# Patient Record
Sex: Male | Born: 1965 | Race: Black or African American | Hispanic: No | Marital: Married
Health system: Midwestern US, Community
[De-identification: ages and names within clinical notes are randomized; demographics above are authoritative.]

## PROBLEM LIST (undated history)

## (undated) DIAGNOSIS — E785 Hyperlipidemia, unspecified: Secondary | ICD-10-CM

## (undated) DIAGNOSIS — R0789 Other chest pain: Secondary | ICD-10-CM

## (undated) DIAGNOSIS — D497 Neoplasm of unspecified behavior of endocrine glands and other parts of nervous system: Secondary | ICD-10-CM

## (undated) DIAGNOSIS — Z1211 Encounter for screening for malignant neoplasm of colon: Secondary | ICD-10-CM

## (undated) DIAGNOSIS — F410 Panic disorder [episodic paroxysmal anxiety] without agoraphobia: Secondary | ICD-10-CM

## (undated) DIAGNOSIS — M25532 Pain in left wrist: Secondary | ICD-10-CM

## (undated) HISTORY — DX: Panic disorder (episodic paroxysmal anxiety): F41.0

## (undated) HISTORY — DX: Other chest pain: R07.89

## (undated) HISTORY — DX: Encounter for screening for malignant neoplasm of colon: Z12.11

## (undated) HISTORY — DX: Hyperlipidemia, unspecified: E78.5

---

## 2005-05-12 HISTORY — PX: CARDIOVASCULAR STRESS TEST: SHX262

## 2006-03-31 ENCOUNTER — Observation Stay (HOSPITAL_COMMUNITY): Admission: EM | Admit: 2006-03-31 | Discharge: 2006-04-01 | Payer: Self-pay | Admitting: Emergency Medicine

## 2006-07-06 ENCOUNTER — Ambulatory Visit (HOSPITAL_COMMUNITY): Admission: RE | Admit: 2006-07-06 | Discharge: 2006-07-06 | Payer: Self-pay | Admitting: Family Medicine

## 2009-05-12 DIAGNOSIS — D497 Neoplasm of unspecified behavior of endocrine glands and other parts of nervous system: Secondary | ICD-10-CM

## 2009-05-12 HISTORY — DX: Neoplasm of unspecified behavior of endocrine glands and other parts of nervous system: D49.7

## 2009-05-18 ENCOUNTER — Ambulatory Visit (HOSPITAL_COMMUNITY): Admission: RE | Admit: 2009-05-18 | Discharge: 2009-05-18 | Payer: Self-pay | Admitting: Family Medicine

## 2010-09-27 NOTE — H&P (Signed)
NAME:  Billy Alvarez, Billy Alvarez             ACCOUNT NO.:  [id number]   MEDICAL RECORD NO.:  [id number]          PATIENT TYPE:  EMS   LOCATION:  MAJO                         FACILITY:  [facility]   PHYSICIAN:  Kela Millin, M.D.DATE OF BIRTH:  December 16, 1965   DATE OF ADMISSION:  03/31/2006  DATE OF DISCHARGE:                              HISTORY & PHYSICAL   PRIMARY CARE PHYSICIAN:  Unassigned.   CHIEF COMPLAINT:  Chest pain.   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  He describes the pain as midsternal in location, that  it feels like a pressure like something sitting on my chest, 5/10 in  intensity at its worse, lasting a couple of hours at a time.  The  patient also states that it is associated with nausea, shortness of  breath, and radiates to his neck.  Billy Alvarez also reports that he  experiences the pain mostly while he is driving, although sometimes when  he is just resting.  The patient also states that the pain has worsened  in intensity and duration over the past 2-3 weeks.  He denies cough,  fevers, hematemesis, melena, diarrhea, and no hematochezia.   He was seen in the ER and the D-dimer was normal at less than 0.22.  Chest x-ray negative for  infiltrates and his point of care markers are  negative.  He continues to have some chest discomfort; and he is  admitted to the [facility] service for further evaluation and  management.   PAST MEDICAL HISTORY:  None.   MEDICATIONS:  None.   ALLERGIES:  None.   SOCIAL HISTORY:  He denies tobacco.  He also denies alcohol and no  illicit drug use.   FAMILY HISTORY:  He states that his father had an MI at age 53.  He  denies any family history of diabetes, hypertension, or hyperlipidemia.   REVIEW OF SYSTEMS:  See HPI, other review  of systems negative.   PHYSICAL EXAMINATION:  GENERAL:  The patient is a middle-aged white  male.  He is alert and oriented in no apparent distress, well developed  and well nourished.  VITAL SIGNS:  His temperature is 97.5, blood pressure 126/79, initially  137/80, and his pulse is 59, respiratory rate is 16, O2 saturation is  99% on room air.  HEENT:  PERRL, EOMI.  Sclerae anicteric.  Moist mucous membranes.  No  oral exudates.  NECK:  Supple.  No lymphadenopathy, no thyromegaly.  No JVD.  LUNGS:  Clear to auscultation bilaterally.  No crackles or wheezes.  CARDIOVASCULAR:  Regular rate and rhythm.  Normal S1 and S2, no murmurs.  No gallops and no rubs.  ABDOMEN:  Soft, bowel sounds present, nontender, nondistended.  No  organomegaly and no masses palpable.  EXTREMITIES:  No cyanosis and no edema.  NEUROLOGIC:  Alert and oriented x3.  Cranial nerves II-XII grossly  intact, nonfocal exam.  LABORATORY DATA:  Chest x-ray--no acute infiltrates.  Point of care  markers are negative x2.  The D-dimer is less than 0.22.  Sodium 138,  potassium 4.6, chloride 104, BUN 18, glucose 97, and his creatinine is  1.1.  Hemoglobin is 14.3, hematocrit 42%. His pH is 7.34, pCO2 of 57 and  his INR is 1.0.  EKG--sinus bradycardia at a rate of 56, no acute  ischemic changes noted.   ASSESSMENT AND PLAN:  ?  Unstable angina--as discussed above.  The  patient is a 45 year old white male who presents with chest pain,  worsening over the past 2-3 weeks. EKG showing sinus bradycardia with a  rate of 56, no ischemic changes noted and point of care markers  negative.  He does not have a primary care physician and has not been to  any doctor in a while.  I will obtain serial cardiac enzymes and an EKG.  Will place on aspirin, nitrates, and Lovenox.  We will consult  cardiology and follow.  Will hold off beta blockers secondary to  bradycardia and follow.  Will obtain a fasting lipid profile.  Consult   cardiology for further recommendations/risk stratification.      Kela Millin, M.D.  Electronically Signed     ACV/MEDQ  D:  03/31/2006  T:  03/31/2006  Job:  [id number]

## 2010-09-27 NOTE — Consult Note (Signed)
NAME:  Billy Alvarez NO.:  [id number]   MEDICAL RECORD NO.:  [id number]          PATIENT TYPE:  INP   LOCATION:  1828                         FACILITY:  [facility]   PHYSICIAN:  Vesta Mixer, M.D. DATE OF BIRTH:  [birth year]   DATE OF CONSULTATION:  03/31/2006  DATE OF DISCHARGE:                                   CONSULTATION   Billy Alvarez is a 45 year old gentleman who was admitted to the hospital  with episodes of chest pain.  We are asked to see him for further  evaluation.   Billy Alvarez has been a relatively healthy gentleman.  He really has not ever been  to see a doctor since his pediatrician.   He presents now with 2 to 3 weeks of some chest pressure and chest  tightness.  He states that the sensation comes and goes.  It is not  associated with any specific activity.  Specifically, it is not associated  with eating, drinking change of position, taking a deep breath or exercise.  He has noticed that he is a little more short of breath when he climbs  stairs recently.  He presented to emergency room today.  We were asked to  see him for further evaluation.   He has been quite comfortable in the ER.  He states that he still has a  nagging sensation in his lower chest but has not really had any episodes of  chest pain.   CURRENT MEDICATIONS:  None.   ALLERGIES:  None.   PAST MEDICAL HISTORY:  None.   SOCIAL HISTORY:  The patient works for a Nurse, adult.  He is a  nonsmoker and does not drink alcohol.   FAMILY HISTORY:  His father had a myocardial infarction at age 53.   His review of systems was reviewed and is essentially negative.   He is a young white gentleman in no acute distress.  His vital signs are  stable.  LUNGS:  Clear to auscultation.  HEART:  Regular rate, S1-S2.  He has no murmurs.  ABDOMINAL EXAM:  Reveals good bowel sounds and is nontender.  EXTREMITIES:  There is no clubbing, cyanosis or edema.  His pulses are  intact.   He has no calf tenderness.  No palpable cords.  NEUROLOGICAL EXAM:  Is nonfocal.  His cranial nerves II-XII are intact.  His  motor and sensory function are intact.   EKG reveals sinus bradycardia.  He has no ST- or T-wave changes.   His cardiac enzymes (point care markers) are negative.   IMPRESSION/PLAN:  1. Chest pain.  The patient presents with rather atypical chest pain.  The      chest pain has been intermittent for 2 weeks.  His enzymes are      negative.  At this point, I would agree with admission.  We will check      his cardiac enzymes and ambulate him in the morning.  If he does well      and does not have any further episodes of chest pain and if his enzymes      remain  negative, then we can probably discharge him to home and get the      remaining evaluation as an outpatient.  I do not think that we need to      have him sit around the hospital over the Thanksgiving weekend if he      remains very clinically stable.  He has my card and will me for      questions.   All of his other medical problems are stable.  We will check a fasting lipid  profile in the morning.           ______________________________  Vesta Mixer, M.D.     PJN/MEDQ  D:  03/31/2006  T:  04/01/2006  Job:  [id number]

## 2011-03-06 DIAGNOSIS — D352 Benign neoplasm of pituitary gland: Secondary | ICD-10-CM | POA: Insufficient documentation

## 2014-09-21 DIAGNOSIS — E221 Hyperprolactinemia: Secondary | ICD-10-CM | POA: Insufficient documentation

## 2015-05-16 ENCOUNTER — Emergency Department (HOSPITAL_COMMUNITY)
Admission: EM | Admit: 2015-05-16 | Discharge: 2015-05-16 | Disposition: A | Discharge status: Home / Self Care | Payer: 59 | Attending: Emergency Medicine | Admitting: Emergency Medicine

## 2015-05-16 ENCOUNTER — Emergency Department (HOSPITAL_COMMUNITY): Payer: 59

## 2015-05-16 ENCOUNTER — Encounter (HOSPITAL_COMMUNITY): Payer: Self-pay

## 2015-05-16 DIAGNOSIS — Z8603 Personal history of neoplasm of uncertain behavior: Secondary | ICD-10-CM | POA: Diagnosis not present

## 2015-05-16 DIAGNOSIS — R110 Nausea: Secondary | ICD-10-CM | POA: Diagnosis not present

## 2015-05-16 DIAGNOSIS — R0789 Other chest pain: Secondary | ICD-10-CM | POA: Diagnosis not present

## 2015-05-16 DIAGNOSIS — R079 Chest pain, unspecified: Secondary | ICD-10-CM | POA: Diagnosis present

## 2015-05-16 DIAGNOSIS — R42 Dizziness and giddiness: Secondary | ICD-10-CM | POA: Insufficient documentation

## 2015-05-16 HISTORY — DX: Neoplasm of unspecified behavior of endocrine glands and other parts of nervous system: D49.7

## 2015-05-16 LAB — CBC
HEMATOCRIT: 41.5 % (ref 39.0–52.0)
Hemoglobin: 14.3 g/dL (ref 13.0–17.0)
MCH: 30.6 pg (ref 26.0–34.0)
MCHC: 34.5 g/dL (ref 30.0–36.0)
MCV: 88.9 fL (ref 78.0–100.0)
PLATELETS: 259 10*3/uL (ref 150–400)
RBC: 4.67 MIL/uL (ref 4.22–5.81)
RDW: 13.3 % (ref 11.5–15.5)
WBC: 7.2 10*3/uL (ref 4.0–10.5)

## 2015-05-16 LAB — BASIC METABOLIC PANEL
Anion gap: 9 (ref 5–15)
BUN: 20 mg/dL (ref 6–20)
CHLORIDE: 103 mmol/L (ref 101–111)
CO2: 28 mmol/L (ref 22–32)
CREATININE: 1.08 mg/dL (ref 0.61–1.24)
Calcium: 9.6 mg/dL (ref 8.9–10.3)
GFR calc non Af Amer: >60 mL/min (ref >60)
Glucose, Bld: 122 mg/dL — ABNORMAL HIGH (ref 65–99)
Potassium: 3.9 mmol/L (ref 3.5–5.1)
Sodium: 140 mmol/L (ref 135–145)

## 2015-05-16 LAB — TROPONIN I
Troponin I: <0.03 ng/mL (ref <0.031)
Troponin I: <0.03 ng/mL (ref <0.031)

## 2015-05-16 MED ORDER — ASPIRIN 81 MG PO CHEW
324.0000 mg | CHEWABLE_TABLET | Freq: Once | ORAL | Status: AC
  Administered 2015-05-16: 324 mg via ORAL
  Filled 2015-05-16: qty 4

## 2015-05-16 MED ORDER — NITROGLYCERIN 0.4 MG SL SUBL: 0.4000 mg | SUBLINGUAL_TABLET | SUBLINGUAL | Status: DC | PRN

## 2015-05-16 MED ORDER — NITROGLYCERIN 2 % TD OINT
1.0000 [in_us] | TOPICAL_OINTMENT | Freq: Once | TRANSDERMAL | Status: AC
  Administered 2015-05-16: 1 [in_us] via TOPICAL
  Filled 2015-05-16: qty 1

## 2015-05-16 NOTE — Discharge Instructions (Signed)
Keep your appointment next week with your new primary care doctor. Call Dr. Kyla Balzarine office to get an appointment to have him evaluate her chest pain again. Try the nitroglycerin sublingual tablets if you get the chest pain again. Please sit down when you use the tablets so you don't get lightheaded or pass out. Return to the ED if your symptoms seem worse.

## 2015-05-16 NOTE — ED Notes (Signed)
Patient states minimal chest pain over the past week, this morning patient states he woke up with dull pressure in central chest with nausea.

## 2015-05-16 NOTE — ED Provider Notes (Signed)
CSN: [id number]     Arrival date & time 05/16/15  0324 History   First MD Initiated Contact with Patient 05/16/15 [phone]    Chief Complaint  Patient presents with  . Chest Pain     (Consider location/radiation/quality/duration/timing/severity/associated sxs/prior Treatment) HPI patient reports for the past few weeks he hasn't felt right and describes he has a pressure and tightness feeling in his chest. He states it can last for hours. He states he gets it when he feels stressed such as driving to work. He states he started going to the gym and he can exercise on the treadmill without any discomfort. He also states sometimes he suddenly feels woozy and dizzy and he get clammy. He states tonight about 2 AM he woke up and he had nausea and he felt lightheaded and felt like his heart was racing. He finally woke up his wife about a half hour later and his heart rate was 102. He states normally his heart rates around 60. He had some sweating and some mild nausea. He states he feels anxious and although he's not under any increased stress he feels stressed. He states he had similar symptoms about 7 or 8 years ago and was seen by Dr Johnsie Cancel, cardiologist and had a stress test done that was okay. He denies history of hypertension, diabetes, but states he was told his cholesterol was high however he has not taken any medication for that in years. He states his father died at age 11 and he had had a MI or congestive heart failure and bypass surgery. His paternal grandmother also had a history of an MI.   PCP Dr Ernestine Conrad, first new appt in 5 days, has not seen in several years  Past Medical History  Diagnosis Date  . 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 No pertinent past surgical history. History reviewed. No pertinent family history. Social History  Substance Use Topics  . Smoking status: Never Smoker   . Smokeless tobacco: None  . Alcohol Use: No  employed Lives with spouse  Review of Systems   All other systems reviewed and are negative.     Allergies  Review of patient's allergies indicates no known allergies.  Home Medications   Prior to Admission medications   Medication Sig Start Date End Date Taking? Authorizing Provider  cabergoline (DOSTINEX) 0.5 MG tablet Take 0.5 mg by mouth 2 (two) times a week.   Yes Historical Provider, MD  nitroGLYCERIN (NITROSTAT) 0.4 MG SL tablet Place 1 tablet (0.4 mg total) under the tongue every 5 (five) minutes as needed for chest pain. 05/16/15   Rolland Porter, MD   BP 113/72 mmHg  Pulse 59  Temp(Src) 98.1 F (36.7 C) (Oral)  Resp 12  Ht 6\' 2"  (1.88 m)  Wt 210 lb (95.255 kg)  BMI 26.95 kg/m2  SpO2 98%  Vital signs normal   Physical Exam  Constitutional: He is oriented to person, place, and time. He appears well-developed and well-nourished.  Non-toxic appearance. He does not appear ill. No distress.  HENT:  Head: Normocephalic and atraumatic.  Right Ear: External ear normal.  Left Ear: External ear normal.  Nose: Nose normal. No mucosal edema or rhinorrhea.  Mouth/Throat: Oropharynx is clear and moist and mucous membranes are normal. No dental abscesses or uvula swelling.  Eyes: Conjunctivae and EOM are normal. Pupils are equal, round, and reactive to light.  Neck: Normal range of motion and full passive range of motion without pain. Neck supple.  Cardiovascular: Normal rate, regular rhythm and normal heart sounds.  Exam reveals no gallop and no friction rub.   No murmur heard. Pulmonary/Chest: Effort normal and breath sounds normal. No respiratory distress. He has no wheezes. He has no rhonchi. He has no rales. He exhibits no tenderness and no crepitus.  Abdominal: Soft. Normal appearance and bowel sounds are normal. He exhibits no distension. There is no tenderness. There is no rebound and no guarding.  Musculoskeletal: Normal range of motion. He exhibits no edema or tenderness.  Moves all extremities well.   Neurological: He is  alert and oriented to person, place, and time. He has normal strength. No cranial nerve deficit.  Skin: Skin is warm, dry and intact. No rash noted. No erythema. No pallor.  Psychiatric: He has a normal mood and affect. His speech is normal and behavior is normal. His mood appears not anxious.  Nursing note and vitals reviewed.   ED Course  Procedures (including critical care time)  Medications  aspirin chewable tablet 324 mg (324 mg Oral Given 05/16/15 0407)  nitroGLYCERIN (NITROGLYN) 2 % ointment 1 inch (1 inch Topical Given 05/16/15 0407)    Patient had nitroglycerin paste placed and was given a full dose of aspirin to chew. Evaluation for possible cardiac event was started.  5 AM we discussed patient's test results. We also discussed need to get the delta troponin done around 6 AM. He states he currently is not having any chest discomfort.  Recheck at 6:55 AM patient is smiling and states he feels better. We discussed following up with his cardiologist. We will try sublingual nitroglycerin to use for his chest pain recurs. His chest pain is atypical and that he does not get it with exertion and may be more related to anxiety.   Labs Review Results for orders placed or performed during the hospital encounter of [id number]  Basic metabolic panel  Result Value Ref Range   Sodium 140 135 - 145 mmol/L   Potassium 3.9 3.5 - 5.1 mmol/L   Chloride 103 101 - 111 mmol/L   CO2 28 22 - 32 mmol/L   Glucose, Bld 122 (H) 65 - 99 mg/dL   BUN 20 6 - 20 mg/dL   Creatinine, Ser 1.08 0.61 - 1.24 mg/dL   Calcium 9.6 8.9 - 10.3 mg/dL   GFR calc non Af Amer >60 >60 mL/min   GFR calc Af Amer >60 >60 mL/min   Anion gap 9 5 - 15  CBC  Result Value Ref Range   WBC 7.2 4.0 - 10.5 K/uL   RBC 4.67 4.22 - 5.81 MIL/uL   Hemoglobin 14.3 13.0 - 17.0 g/dL   HCT 41.5 39.0 - 52.0 %   MCV 88.9 78.0 - 100.0 fL   MCH 30.6 26.0 - 34.0 pg   MCHC 34.5 30.0 - 36.0 g/dL   RDW 13.3 11.5 - 15.5 %   Platelets 259 150 -  400 K/uL  Troponin I  Result Value Ref Range   Troponin I <0.03 <0.031 ng/mL  Troponin I  Result Value Ref Range   Troponin I <0.03 <0.031 ng/mL    Laboratory interpretation all normal including delta troponin's   Imaging Review Dg Chest 2 View  05/16/2015  CLINICAL DATA:  Acute onset of generalized chest discomfort and pain. Initial encounter. EXAM: CHEST  2 VIEW COMPARISON:  Chest radiograph performed 03/31/2006 FINDINGS: The lungs are well-aerated. Pulmonary vascularity is at the upper limits of normal. There is no evidence of focal  opacification, pleural effusion or pneumothorax. The heart is normal in size; the mediastinal contour is within normal limits. No acute osseous abnormalities are seen. IMPRESSION: No acute cardiopulmonary process seen. Electronically Signed   By: Garald Balding M.D.   On: 05/16/2015 04:35   I have personally reviewed and evaluated these images and lab results as part of my medical decision-making.   EKG Interpretation   Date/Time:  Wednesday May 16 2015 03:33:27 EST Ventricular Rate:  57 PR Interval:  160 QRS Duration: 103 QT Interval:  420 QTC Calculation: 409 R Axis:   69 Text Interpretation:  Sinus rhythm RSR' in V1 or V2, probably normal  variant Early repolarization pattern No significant change since last  tracing 01 Apr 2006 Confirmed by [facility]  MD-I, Maggie Senseney ([id number]) on 05/16/2015  4:00:44 AM      MDM   Final diagnoses:  Atypical chest pain    New Prescriptions   NITROGLYCERIN (NITROSTAT) 0.4 MG SL TABLET    Place 1 tablet (0.4 mg total) under the tongue every 5 (five) minutes as needed for chest pain.    Plan discharge  Rolland Porter, MD, Barbette Or, MD 05/16/15 [phone]

## 2015-05-21 ENCOUNTER — Ambulatory Visit: Payer: Self-pay | Admitting: Family Medicine

## 2015-05-21 ENCOUNTER — Ambulatory Visit (INDEPENDENT_AMBULATORY_CARE_PROVIDER_SITE_OTHER): Payer: 59 | Admitting: Family Medicine

## 2015-05-21 ENCOUNTER — Encounter: Payer: Self-pay | Admitting: Family Medicine

## 2015-05-21 VITALS — BP 132/75 | HR 54 | Temp 97.9°F | Resp 16 | Ht 73.0 in | Wt 210.5 lb

## 2015-05-21 DIAGNOSIS — R0789 Other chest pain: Secondary | ICD-10-CM | POA: Diagnosis not present

## 2015-05-21 DIAGNOSIS — F410 Panic disorder [episodic paroxysmal anxiety] without agoraphobia: Secondary | ICD-10-CM | POA: Diagnosis not present

## 2015-05-21 DIAGNOSIS — Z23 Encounter for immunization: Secondary | ICD-10-CM | POA: Diagnosis not present

## 2015-05-21 MED ORDER — CLONAZEPAM 0.5 MG PO TABS: ORAL_TABLET | ORAL | Status: DC

## 2015-05-21 MED ORDER — CITALOPRAM HYDROBROMIDE 20 MG PO TABS: 20.0000 mg | ORAL_TABLET | Freq: Every day | ORAL | Status: DC

## 2015-05-21 NOTE — Progress Notes (Signed)
Pre visit review using our clinic review tool, if applicable. No additional management support is needed unless otherwise documented below in the visit note. 

## 2015-05-21 NOTE — Progress Notes (Signed)
Office Note 05/21/2015  CC:  Chief Complaint  Patient presents with  . Establish Care  . Chest Pain    seen at ER     HPI:  Billy Alvarez is a 50 y.o. White male who is here to establish care, discuss recent CP. Patient's most recent primary MD: Dr. Caren Griffins Burns--endocrinologist.  Former PCP was Dr. Cleta Alberts and myself at [facility] in [place], [place] y[place] ago. Old records in EPIC/[facility] EMR were reviewed prior to or during today's visit.  Went to ED 05/16/15 for atypical CP, ruled out for ACS, CBC, BMET also normal, CXR normal. Dx of possible stress/anxiety-related CP, was told to f/u with his cardiologist, though, and was given sl nitroglycerine to use prn.  Similar ED visit 09/2005 and was seen by Dr. Acie Fredrickson in cardiology--plan was to see him and do w/u as outpt.  Stress test normal 2007, no f/u with cardiology since that time.  Last 1 mo or so he describes recurrent chest tightness, pressure in chest.  Happens every day now.  Cold/clammy feeling would occur with this many times,feeling of lightheadedness, pt afraid he may pass out. seems to be getting progressively worse.   Recent severe episode prompted ED visit mentioned above.  Happens more when driving. He currently feels a little anterior chest tightness, no other sx's.  Says sometimes a burp helps this. No arm pain, no jaw pain.  He works out on treadmill regularly and he feels better: no chest pains with exercise/exertion.  Last endo f/u was 07/2014. Pt reports his pit adenoma has shrunk considerably.  Says annual lab f/u with endo has shown normal lab work.  Past Medical History  Diagnosis Date  . 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 Hyperlipidemia     med rx'd but pt eventually became noncompliant    History reviewed. No pertinent past surgical history.  Family History  Problem Relation Age of Onset  . Heart disease Father   . Heart attack Paternal Grandmother   . Cancer Neg Hx   . Diabetes Neg Hx     Social  History   Social History  . Marital Status: Married    Spouse Name: N/A  . Number of Children: N/A  . Years of Education: N/A   Occupational History  . Not on file.   Social History Main Topics  . Smoking status: Never Smoker   . Smokeless tobacco: Never Used  . Alcohol Use: No  . Drug Use: No  . Sexual Activity: Not on file   Other Topics Concern  . Not on file   Social History Narrative   Married, 1 adult daughter, 1 adult son.   Educ: HS   Living in [place].   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   No T/A/Ds.    Outpatient Encounter Prescriptions as of 05/21/2015  Medication Sig  . aspirin 81 MG tablet Take 81 mg by mouth daily.  . cabergoline (DOSTINEX) 0.5 MG tablet Take 0.5 mg by mouth 2 (two) times a week.  . nitroGLYCERIN (NITROSTAT) 0.4 MG SL tablet Place 1 tablet (0.4 mg total) under the tongue every 5 (five) minutes as needed for chest pain.  . citalopram (CELEXA) 20 MG tablet Take 1 tablet (20 mg total) by mouth daily.  . clonazePAM (KLONOPIN) 0.5 MG tablet 1-2 tabs po bid prn anxiety   No facility-administered encounter medications on file as of 05/21/2015.  ASA 81mg  qd  No Known Allergies  ROS Review of Systems  Constitutional: Negative for fever and fatigue.  HENT: Negative for congestion and sore throat.   Eyes: Negative for visual disturbance.  Respiratory: Negative for cough.   Gastrointestinal: Negative for nausea and abdominal pain.  Genitourinary: Negative for dysuria.  Musculoskeletal: Negative for back pain and joint swelling.  Skin: Negative for rash.  Neurological: Negative for weakness and headaches.  Hematological: Negative for adenopathy.    PE; Blood pressure 132/75, pulse 54, temperature 97.9 F (36.6 C), temperature source Oral, resp. rate 16, height 6\' 1"  (1.854 m), weight 210 lb 8 oz (95.482 kg), SpO2 98 %. Gen: Alert, well appearing.  Patient is oriented to person, place, time, and situation. [id number]: no injection,  icteris, swelling, or exudate.  EOMI, PERRLA. Mouth: lips without lesion/swelling.  Oral mucosa pink and moist. Oropharynx without erythema, exudate, or swelling.  Neck - No masses or thyromegaly or limitation in range of motion CV: RRR, no m/r/g.   LUNGS: CTA bilat, nonlabored resps, good aeration in all lung fields. EXT: no clubbing, cyanosis, or edema.   Pertinent labs:  Lab Results  Component Value Date   WBC 7.2 05/16/2015   HGB 14.3 05/16/2015   HCT 41.5 05/16/2015   MCV 88.9 05/16/2015   PLT 259 05/16/2015     Chemistry      Component Value Date/Time   NA 140 05/16/2015 0340   K 3.9 05/16/2015 0340   CL 103 05/16/2015 0340   CO2 28 05/16/2015 0340   BUN 20 05/16/2015 0340   CREATININE 1.08 05/16/2015 0340      Component Value Date/Time   CALCIUM 9.6 05/16/2015 0340      ASSESSMENT AND PLAN:   New pt: obtain old records.  1) Atypical chest pain; very likely a manifestation of panic disorder.   Plan is to keep appt with his cardiologist: he already has this appt set up. Start citalopram 20mg  qd.  Therapeutic expectations and side effect profile of medication discussed today.  Patient's questions answered. Also start clonazepam 0.5mg , 1-2 tabs po bid prn for days when he has unrelenting or waxing/waning sx's.  Therapeutic expectations and side effect profile of medication discussed today.  Patient's questions answered. He is to f/u for fasting CPE in 1 mo, at which time we'll see how he's doing regarding this problem. Will get CBC, CMET, TSH, and FLP at this 28mo f/u visit--I don't feel any labs are indicated today (pt not fasting). Flu vaccine given today.  An After Visit Summary was printed and given to the patient.  Return in about 4 weeks (around 06/18/2015) for annual CPE (fasting).  Needs fasting HP+needs referral for screening colonoscopy, +DRE and PSA.

## 2015-06-12 ENCOUNTER — Ambulatory Visit (INDEPENDENT_AMBULATORY_CARE_PROVIDER_SITE_OTHER): Payer: 59 | Admitting: Cardiovascular Disease

## 2015-06-12 ENCOUNTER — Ambulatory Visit: Payer: 59 | Admitting: Cardiovascular Disease

## 2015-06-12 ENCOUNTER — Encounter: Payer: Self-pay | Admitting: Cardiovascular Disease

## 2015-06-12 VITALS — BP 108/80 | HR 74 | Ht 74.0 in | Wt 219.4 lb

## 2015-06-12 DIAGNOSIS — R0789 Other chest pain: Secondary | ICD-10-CM | POA: Diagnosis not present

## 2015-06-12 NOTE — Patient Instructions (Signed)
Medication Instructions:  Your physician recommends that you continue on your current medications as directed. Please refer to the Current Medication list given to you today.   Labwork: None Ordered   Testing/Procedures: None Ordered   Follow-Up: Your physician recommends that you schedule a follow-up appointment in: as needed with Dr. Nahser   If you need a refill on your cardiac medications before your next appointment, please call your pharmacy.   Thank you for choosing [facility]! Thong Feeny, RN [phone]    

## 2015-06-12 NOTE — Progress Notes (Signed)
Cardiology Office Note   Date:  06/12/2015   ID:  Billy Alvarez, DOB 08-Oct-1965, MRN [id number]  PCP:  Tammi Sou, MD  Cardiologist:   Thayer Headings, MD   Chief Complaint  Patient presents with  . Chest Pain   Problem list 1. Chest pain   History of Present Illness: Billy Alvarez is a 50 y.o. male who presents for evaluation of an episode of CP.   He has been feeling some chest pressure.  Past several months   Has progressed recently  Has it while driving to work , has to pull over and let it resolve. Has had some tachycardia,   + sweats,   HR 110.  Has seen his primary medical doctor ,  Thought it was due to anxiety  Was given Celexa and Klonopin for break through anxiety His symptoms have greatly improved since starting the Celexa   No symptoms with exertion .  Walks on the treadmill, elliptical , lifts weights.  Feels great after the work out  Has seen me in the distant past for similar symptoms.   Had a negative stress test in 2007.   Works for [employer].   Father and father in law have passed away this past year.  So lots of stresses but he feels ok with these.  No financial issues .     Past Medical History  Diagnosis Date  . 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 Hyperlipidemia     med rx'd but pt eventually became noncompliant  . Panic disorder   . Atypical chest pain 2007; 2016    2007 stress test neg per pt    Past Surgical History  Procedure Laterality Date  . Cardiovascular stress test  2007    Neg per pt report     Current Outpatient Prescriptions  Medication Sig Dispense Refill  . aspirin 81 MG tablet Take 81 mg by mouth daily.    . cabergoline (DOSTINEX) 0.5 MG tablet Take 0.5 mg by mouth 2 (two) times a week.    . citalopram (CELEXA) 20 MG tablet Take 1 tablet (20 mg total) by mouth daily. 30 tablet 1  . clonazePAM (KLONOPIN) 0.5 MG tablet 1-2 tabs po bid prn anxiety 30 tablet 1  . nitroGLYCERIN (NITROSTAT) 0.4 MG SL  tablet Place 1 tablet (0.4 mg total) under the tongue every 5 (five) minutes as needed for chest pain. 30 tablet 0   No current facility-administered medications for this visit.    Allergies:   Review of patient's allergies indicates no known allergies.    Social History:  The patient  reports that he has never smoked. He has never used smokeless tobacco. He reports that he does not drink alcohol or use illicit drugs.   Family History:  The patient's family history includes Heart attack in his paternal grandmother; Heart disease in his father. There is no history of Cancer or Diabetes.    ROS:  Please see the history of present illness.    Review of Systems: Constitutional:  denies fever, chills, diaphoresis, appetite change and fatigue.  HEENT: denies photophobia, eye pain, redness, hearing loss, ear pain, congestion, sore throat, rhinorrhea, sneezing, neck pain, neck stiffness and tinnitus.  Respiratory: denies SOB, DOE, cough, chest tightness, and wheezing.  Cardiovascular: denies chest pain, palpitations and leg swelling.  Gastrointestinal: denies nausea, vomiting, abdominal pain, diarrhea, constipation, blood in stool.  Genitourinary: denies dysuria, urgency, frequency, hematuria, flank pain and difficulty urinating.  Musculoskeletal: denies  myalgias, back pain, joint swelling, arthralgias and gait problem.   Skin: denies pallor, rash and wound.  Neurological: denies dizziness, seizures, syncope, weakness, light-headedness, numbness and headaches.   Hematological: denies adenopathy, easy bruising, personal or family bleeding history.  Psychiatric/ Behavioral: denies suicidal ideation, mood changes, confusion, nervousness, sleep disturbance and agitation.       All other systems are reviewed and negative.    PHYSICAL EXAM: VS:  BP 108/80 mmHg  Pulse 74  Ht 6\' 2"  (1.88 m)  Wt 219 lb 6.4 oz (99.519 kg)  BMI 28.16 kg/m2 , BMI Body mass index is 28.16 kg/(m^2). GEN: Well  nourished, well developed, in no acute distress HEENT: normal Neck: no JVD, carotid bruits, or masses Cardiac: RRR; no murmurs, rubs, or gallops,no edema  Respiratory:  clear to auscultation bilaterally, normal work of breathing GI: soft, nontender, nondistended, + BS MS: no deformity or atrophy Skin: warm and dry, no rash Neuro:  Strength and sensation are intact Psych: normal   EKG:  EKG is not ordered today. The ekg ordered Jan. 4, 2017  demonstrates NSR , no ST or T wave    Recent Labs: 05/16/2015: BUN 20; Creatinine, Ser 1.08; Hemoglobin 14.3; Platelets 259; Potassium 3.9; Sodium 140    Lipid Panel No results found for: CHOL, TRIG, HDL, CHOLHDL, VLDL, LDLCALC, LDLDIRECT    Wt Readings from Last 3 Encounters:  06/12/15 219 lb 6.4 oz (99.519 kg)  05/21/15 210 lb 8 oz (95.482 kg)  05/16/15 210 lb (95.255 kg)      Other studies Reviewed: Additional studies/ records that were reviewed today include: . Review of the above records demonstrates:    ASSESSMENT AND PLAN:  1.  Atypical chest pain:   Billy Alvarez is very stable. His chest pains are very atypical and are most likely related to stress. He does not have any episodes of chest pain when he's working out. His symptoms have improved on Celexa.  he had very similar symptoms when I saw him in 2007. A stress Myoview study at that time was negative.  I've reassured him that his symptoms sound fairly benign. I'll see him on an as-needed basis. He'll be seeing his general medical doctor and his endocrinologist for further evaluation.   Current medicines are reviewed at length with the patient today.  The patient does not have concerns regarding medicines.  The following changes have been made:  no change  Labs/ tests ordered today include:  No orders of the defined types were placed in this encounter.     Disposition:   FU with me as needed.       Inita Uram, Wonda Cheng, MD  06/12/2015 10:10 AM    [address], [place], [place]  [address] Phone: [phone]; Fax: [phone]   [facility]  [address] [place], [place]  [address] [phone]   Fax [phone]

## 2015-06-14 ENCOUNTER — Ambulatory Visit (INDEPENDENT_AMBULATORY_CARE_PROVIDER_SITE_OTHER): Payer: 59 | Admitting: Family Medicine

## 2015-06-14 ENCOUNTER — Encounter: Payer: Self-pay | Admitting: Family Medicine

## 2015-06-14 VITALS — BP 121/77 | HR 55 | Temp 97.8°F | Resp 16 | Ht 73.0 in | Wt 213.8 lb

## 2015-06-14 DIAGNOSIS — Z0000 Encounter for general adult medical examination without abnormal findings: Secondary | ICD-10-CM

## 2015-06-14 DIAGNOSIS — Z125 Encounter for screening for malignant neoplasm of prostate: Secondary | ICD-10-CM

## 2015-06-14 LAB — COMPREHENSIVE METABOLIC PANEL
ALBUMIN: 4.8 g/dL (ref 3.5–5.2)
ALT: 24 U/L (ref 0–53)
AST: 24 U/L (ref 0–37)
Alkaline Phosphatase: 71 U/L (ref 39–117)
BUN: 17 mg/dL (ref 6–23)
CALCIUM: 9.8 mg/dL (ref 8.4–10.5)
CHLORIDE: 103 meq/L (ref 96–112)
CO2: 28 mEq/L (ref 19–32)
Creatinine, Ser: 1 mg/dL (ref 0.40–1.50)
GFR: 83.52 mL/min (ref >60.00)
Glucose, Bld: 94 mg/dL (ref 70–99)
POTASSIUM: 4.1 meq/L (ref 3.5–5.1)
Sodium: 140 mEq/L (ref 135–145)
Total Bilirubin: 0.7 mg/dL (ref 0.2–1.2)
Total Protein: 7.6 g/dL (ref 6.0–8.3)

## 2015-06-14 LAB — LIPID PANEL
CHOLESTEROL: 236 mg/dL — AB (ref 0–200)
HDL: 54.6 mg/dL (ref >39.00)
LDL CALC: 162 mg/dL — AB (ref 0–99)
NonHDL: 181.57
TRIGLYCERIDES: 99 mg/dL (ref 0.0–149.0)
Total CHOL/HDL Ratio: 4
VLDL: 19.8 mg/dL (ref 0.0–40.0)

## 2015-06-14 LAB — CBC WITH DIFFERENTIAL/PLATELET
BASOS ABS: 0 10*3/uL (ref 0.0–0.1)
Basophils Relative: 0.5 % (ref 0.0–3.0)
EOS ABS: 0.1 10*3/uL (ref 0.0–0.7)
Eosinophils Relative: 1.7 % (ref 0.0–5.0)
HEMATOCRIT: 46.2 % (ref 39.0–52.0)
HEMOGLOBIN: 15.2 g/dL (ref 13.0–17.0)
LYMPHS PCT: 37.7 % (ref 12.0–46.0)
Lymphs Abs: 2.2 10*3/uL (ref 0.7–4.0)
MCHC: 32.8 g/dL (ref 30.0–36.0)
MCV: 90 fl (ref 78.0–100.0)
MONO ABS: 0.4 10*3/uL (ref 0.1–1.0)
Monocytes Relative: 7.1 % (ref 3.0–12.0)
Neutro Abs: 3.1 10*3/uL (ref 1.4–7.7)
Neutrophils Relative %: 53 % (ref 43.0–77.0)
Platelets: 276 10*3/uL (ref 150.0–400.0)
RBC: 5.13 Mil/uL (ref 4.22–5.81)
RDW: 14.1 % (ref 11.5–15.5)
WBC: 5.9 10*3/uL (ref 4.0–10.5)

## 2015-06-14 LAB — TSH: TSH: 1.76 u[IU]/mL (ref 0.35–4.50)

## 2015-06-14 LAB — PSA: PSA: 0.41 ng/mL (ref 0.10–4.00)

## 2015-06-14 NOTE — Progress Notes (Signed)
Pre visit review using our clinic review tool, if applicable. No additional management support is needed unless otherwise documented below in the visit note. 

## 2015-06-14 NOTE — Progress Notes (Signed)
Office Note 06/14/2015  CC:  Chief Complaint  Patient presents with  . Annual Exam    Pt is fasting.     HPI:  Billy Alvarez is a 50 y.o. White male who is here for annual health maintenance exam. Feels great from a panic standpoint: 90% improved. Has occ brief, mild CP--nonexertional.  Saw cardiologist and no procedures were recommended. He has taken approx 15 clonaz prn and these help well.    Past Medical History  Diagnosis Date  . 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 Hyperlipidemia     med rx'd but pt eventually became noncompliant  . Panic disorder   . Atypical chest pain 2007; 2016    2007 stress test neg per pt    Past Surgical History  Procedure Laterality Date  . Cardiovascular stress test  2007    Neg per pt report    Family History  Problem Relation Age of Onset  . Heart disease Father   . Heart attack Paternal Grandmother   . Cancer Neg Hx   . Diabetes Neg Hx     Social History   Social History  . Marital Status: Married    Spouse Name: N/A  . Number of Children: N/A  . Years of Education: N/A   Occupational History  . Not on file.   Social History Main Topics  . Smoking status: Never Smoker   . Smokeless tobacco: Never Used  . Alcohol Use: No  . Drug Use: No  . Sexual Activity: Not on file   Other Topics Concern  . Not on file   Social History Narrative   Married, 1 adult daughter, 1 adult son.   Educ: HS   Living in [place].   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   No T/A/Ds.    Outpatient Prescriptions Prior to Visit  Medication Sig Dispense Refill  . cabergoline (DOSTINEX) 0.5 MG tablet Take 0.5 mg by mouth 2 (two) times a week.    . citalopram (CELEXA) 20 MG tablet Take 1 tablet (20 mg total) by mouth daily. 30 tablet 1  . clonazePAM (KLONOPIN) 0.5 MG tablet 1-2 tabs po bid prn anxiety 30 tablet 1  . aspirin 81 MG tablet Take 81 mg by mouth daily. Reported on 06/14/2015    . nitroGLYCERIN (NITROSTAT)  0.4 MG SL tablet Place 1 tablet (0.4 mg total) under the tongue every 5 (five) minutes as needed for chest pain. (Patient not taking: Reported on 06/14/2015) 30 tablet 0   No facility-administered medications prior to visit.    No Known Allergies  ROS Review of Systems  Constitutional: Negative for fever, chills, appetite change and fatigue.  HENT: Negative for congestion, dental problem, ear pain and sore throat.   Eyes: Negative for discharge, redness and visual disturbance.  Respiratory: Negative for cough, chest tightness, shortness of breath and wheezing.   Cardiovascular: Negative for chest pain, palpitations and leg swelling.  Gastrointestinal: Negative for nausea, vomiting, abdominal pain, diarrhea and blood in stool.  Genitourinary: Negative for dysuria, urgency, frequency, hematuria, flank pain and difficulty urinating.  Musculoskeletal: Negative for myalgias, back pain, joint swelling, arthralgias and neck stiffness.  Skin: Negative for pallor and rash.  Neurological: Negative for dizziness, speech difficulty, weakness and headaches.  Hematological: Negative for adenopathy. Does not bruise/bleed easily.  Psychiatric/Behavioral: Negative for confusion and sleep disturbance. The patient is not nervous/anxious.     PE; Blood pressure 121/77, pulse 55, temperature 97.8 F (36.6 C), temperature  source Oral, resp. rate 16, height 6\' 1"  (1.854 m), weight 213 lb 12 oz (96.956 kg), SpO2 100 %. Gen: Alert, well appearing.  Patient is oriented to person, place, time, and situation. AFFECT: pleasant, lucid thought and speech. ENT: Ears: EACs clear, normal epithelium.  TMs with good light reflex and landmarks bilaterally.  Eyes: no injection, icteris, swelling, or exudate.  EOMI, PERRLA. Nose: no drainage or turbinate edema/swelling.  No injection or focal lesion.  Mouth: lips without lesion/swelling.  Oral mucosa pink and moist.  Dentition intact and without obvious caries or gingival  swelling.  Oropharynx without erythema, exudate, or swelling.  Neck: supple/nontender.  No LAD, mass, or TM.  Carotid pulses 2+ bilaterally, without bruits. CV: RRR, no m/r/g.   LUNGS: CTA bilat, nonlabored resps, good aeration in all lung fields. ABD: soft, NT, ND, BS normal.  No hepatospenomegaly or mass.  No bruits. EXT: no clubbing, cyanosis, or edema.  Musculoskeletal: no joint swelling, erythema, warmth, or tenderness.  ROM of all joints intact. Skin - no sores or suspicious lesions or rashes or color changes  Pertinent labs:  None today  ASSESSMENT AND PLAN:   Health maintenance exam:  Reviewed age and gender appropriate health maintenance issues (prudent diet, regular exercise, health risks of tobacco and excessive alcohol, use of seatbelts, fire alarms in home, use of sunscreen).  Also reviewed age and gender appropriate health screening as well as vaccine recommendations. No vaccines today. HP labs + PSA drawn today. Forgot to do DRE so I'll have to do that at his next f/u exam. Colon ca screening: he'll call when ready to schedule this and I'll order referral at that time.  An After Visit Summary was printed and given to the patient.  FOLLOW UP:  Return in about 3 months (around 09/11/2015) for routine chronic illness f/u.

## 2015-07-01 ENCOUNTER — Encounter: Payer: Self-pay | Admitting: Family Medicine

## 2015-07-02 MED ORDER — CLONAZEPAM 0.5 MG PO TABS: ORAL_TABLET | ORAL | Status: DC

## 2015-07-02 MED ORDER — CITALOPRAM HYDROBROMIDE 20 MG PO TABS: 20.0000 mg | ORAL_TABLET | Freq: Every day | ORAL | Status: DC

## 2015-07-02 NOTE — Telephone Encounter (Signed)
LOV: 06/14/15 NOV: 09/11/15  Mychart message, pt requesting 90 day supply be sent to [facility]. Please advise. Thanks.   RF request for clonazepam Last written: 05/21/15 #30 w/ 1RF  RF request for citalopram - Rx sent Last written: 05/21/15 #30 w/ 1RF

## 2015-07-02 NOTE — Telephone Encounter (Signed)
Rx faxed

## 2015-09-11 ENCOUNTER — Ambulatory Visit (INDEPENDENT_AMBULATORY_CARE_PROVIDER_SITE_OTHER): Payer: 59 | Admitting: Family Medicine

## 2015-09-11 ENCOUNTER — Encounter: Payer: Self-pay | Admitting: Family Medicine

## 2015-09-11 VITALS — BP 116/71 | HR 58 | Temp 97.8°F | Resp 16 | Ht 73.0 in | Wt 217.5 lb

## 2015-09-11 DIAGNOSIS — Z1211 Encounter for screening for malignant neoplasm of colon: Secondary | ICD-10-CM

## 2015-09-11 DIAGNOSIS — F410 Panic disorder [episodic paroxysmal anxiety] without agoraphobia: Secondary | ICD-10-CM

## 2015-09-11 DIAGNOSIS — Z125 Encounter for screening for malignant neoplasm of prostate: Secondary | ICD-10-CM | POA: Diagnosis not present

## 2015-09-11 DIAGNOSIS — E785 Hyperlipidemia, unspecified: Secondary | ICD-10-CM

## 2015-09-11 NOTE — Progress Notes (Signed)
OFFICE VISIT  09/11/2015   CC:  Chief Complaint  Patient presents with  . Follow-up    Pt is not fasting.      HPI:    Patient is a 50 y.o. Caucasian male who presents for 3 mo f/u:   1) Anxiety/panic disorder: 100% better!  No adverse side effects.  Takes clonaz about 1-2 times per week.  2) Hyperlipidemia: TLC--working on strength training more than cardio but is trying to increase cardio. No dietary changes at this time.  3) Colon cancer screening: desires cologuard, has not had a colonoscopy yet.  Past Medical History  Diagnosis Date  . 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 Hyperlipidemia     med rx'd but pt eventually became noncompliant  . Panic disorder   . Atypical chest pain 2007; 2016    2007 stress test neg per pt    Past Surgical History  Procedure Laterality Date  . Cardiovascular stress test  2007    Neg per pt report    Outpatient Prescriptions Prior to Visit  Medication Sig Dispense Refill  . cabergoline (DOSTINEX) 0.5 MG tablet Take 0.5 mg by mouth 2 (two) times a week.    . citalopram (CELEXA) 20 MG tablet Take 1 tablet (20 mg total) by mouth daily. 90 tablet 3  . clonazePAM (KLONOPIN) 0.5 MG tablet 1-2 tabs po bid prn anxiety 90 tablet 1   No facility-administered medications prior to visit.    No Known Allergies  ROS As per HPI  PE: Blood pressure 116/71, pulse 58, temperature 97.8 F (36.6 C), temperature source Oral, resp. rate 16, height 6\' 1"  (1.854 m), weight 217 lb 8 oz (98.657 kg), SpO2 95 %. Gen: Alert, well appearing.  Patient is oriented to person, place, time, and situation. AFFECT: pleasant, lucid thought and speech. No further exam today.   LABS:  Lab Results  Component Value Date   TSH 1.76 06/14/2015   Lab Results  Component Value Date   WBC 5.9 06/14/2015   HGB 15.2 06/14/2015   HCT 46.2 06/14/2015   MCV 90.0 06/14/2015   PLT 276.0 06/14/2015   Lab Results  Component Value Date   CREATININE 1.00  06/14/2015   BUN 17 06/14/2015   NA 140 06/14/2015   K 4.1 06/14/2015   CL 103 06/14/2015   CO2 28 06/14/2015   Lab Results  Component Value Date   ALT 24 06/14/2015   AST 24 06/14/2015   ALKPHOS 71 06/14/2015   BILITOT 0.7 06/14/2015   Lab Results  Component Value Date   CHOL 236* 06/14/2015   Lab Results  Component Value Date   HDL 54.60 06/14/2015   Lab Results  Component Value Date   LDLCALC 162* 06/14/2015   Lab Results  Component Value Date   TRIG 99.0 06/14/2015   Lab Results  Component Value Date   CHOLHDL 4 06/14/2015   Lab Results  Component Value Date   PSA 0.41 06/14/2015    IMPRESSION AND PLAN:  1) Panic disorder: The current medical regimen is effective;  continue present plan and medications.  2) Hyperlipidemia: TLC, continue to work on increasing cardio, also get more strict with diet. Will recheck lipids at next CPE in 9 mo.  3) Prostate ca screening: I told him I forgot to do rectal exam last CPE 3 mo ago (but PSA was normal).  He elects to simply defer the DRE until his NEXT CPE in 9 mo.  4)  Colon ca screening: pt defers colonoscopy at this time but wants to do cologuard so we set him up with this today.  An After Visit Summary was printed and given to the patient.  FOLLOW UP: Return in about 9 months (around 06/13/2016) for annual CPE (fasting).  Signed:  Crissie Sickles, MD           09/11/2015

## 2015-09-11 NOTE — Progress Notes (Signed)
Pre visit review using our clinic review tool, if applicable. No additional management support is needed unless otherwise documented below in the visit note. 

## 2015-10-10 ENCOUNTER — Encounter: Payer: Self-pay | Admitting: Family Medicine

## 2015-10-10 NOTE — Telephone Encounter (Signed)
Cologuard was normal.  We'll repeat this in 3 yrs.

## 2015-10-10 NOTE — Telephone Encounter (Signed)
Pt advised and voiced understanding.   

## 2015-10-10 NOTE — Telephone Encounter (Signed)
Please advise. Thanks.  

## 2015-10-18 LAB — COLOGUARD: COLOGUARD: NEGATIVE

## 2016-06-05 ENCOUNTER — Telehealth: Payer: Self-pay | Admitting: *Deleted

## 2016-06-05 MED ORDER — DICLOFENAC SODIUM 75 MG PO TBEC: 75.0000 mg | DELAYED_RELEASE_TABLET | Freq: Two times a day (BID) | ORAL | 0 refills | Status: DC

## 2016-06-05 MED ORDER — CYCLOBENZAPRINE HCL 10 MG PO TABS: 10.0000 mg | ORAL_TABLET | Freq: Three times a day (TID) | ORAL | 0 refills | Status: DC | PRN

## 2016-06-05 NOTE — Telephone Encounter (Signed)
Pt advised and voiced understanding.   

## 2016-06-05 NOTE — Telephone Encounter (Signed)
Rx's sent to [facility]. Must have o/v if meds needed beyond this point. Tell him this is NOT the usual manner of doing things (usually must have o/v first), but I made an exception for him today.  Hope he feels better soon.-thx

## 2016-06-05 NOTE — Telephone Encounter (Signed)
Pt Billy Alvarez on 06/05/16 at 10:09am requesting Dr. Anitra Lauth to send in Rx for a muscle relaxer and diclofenac. He stated that he has done something to his back. He stated that he is unable to get out of his bed and come in for a visit. Please advise. Thanks.

## 2016-06-11 ENCOUNTER — Encounter: Payer: Self-pay | Admitting: Family Medicine

## 2016-06-11 ENCOUNTER — Ambulatory Visit (INDEPENDENT_AMBULATORY_CARE_PROVIDER_SITE_OTHER): Payer: 59 | Admitting: Family Medicine

## 2016-06-11 VITALS — BP 131/82 | HR 75 | Temp 97.6°F | Resp 16 | Wt 228.0 lb

## 2016-06-11 DIAGNOSIS — S39012A Strain of muscle, fascia and tendon of lower back, initial encounter: Secondary | ICD-10-CM

## 2016-06-11 MED ORDER — DICLOFENAC SODIUM 75 MG PO TBEC: DELAYED_RELEASE_TABLET | ORAL | 1 refills | Status: DC

## 2016-06-11 MED ORDER — CYCLOBENZAPRINE HCL 10 MG PO TABS: 10.0000 mg | ORAL_TABLET | Freq: Three times a day (TID) | ORAL | 1 refills | Status: DC | PRN

## 2016-06-11 MED ORDER — HYDROCODONE-ACETAMINOPHEN 5-325 MG PO TABS: 1.0000 | ORAL_TABLET | Freq: Four times a day (QID) | ORAL | 0 refills | Status: DC | PRN

## 2016-06-11 NOTE — Progress Notes (Signed)
OFFICE VISIT  06/11/2016   CC:  Chief Complaint  Patient presents with  . Back Pain    x 1 week, low back pain, felt pulling in lower back when stood up   HPI:    Patient is a 51 y.o. Caucasian male who presents for back pain. He recalls standing up and feeling an abrupt low back pain in mainly R side about a week ago.   I sent him in some flexeril and diclofenac 6 d/a and today is the first day he has felt signif improvement. No radiation of the pain, no paresthesias.  No LE weakness, no loss of bowel/bladder control. He has been stretching his LB and also using an OTC icy hot patch daily.    Past Medical History:  Diagnosis Date  . Atypical chest pain 2007; 2016   2007 stress test neg per pt  . Hyperlipidemia    med rx'd but pt eventually became noncompliant  . Panic disorder   . 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 CARDIOVASCULAR STRESS TEST  2007   Neg per pt report    Outpatient Medications Prior to Visit  Medication Sig Dispense Refill  . cabergoline (DOSTINEX) 0.5 MG tablet Take 0.5 mg by mouth 2 (two) times a week.    . citalopram (CELEXA) 20 MG tablet Take 1 tablet (20 mg total) by mouth daily. 90 tablet 3  . clonazePAM (KLONOPIN) 0.5 MG tablet 1-2 tabs po bid prn anxiety 90 tablet 1  . cyclobenzaprine (FLEXERIL) 10 MG tablet Take 1 tablet (10 mg total) by mouth 3 (three) times daily as needed for muscle spasms. 15 tablet 0  . diclofenac (VOLTAREN) 75 MG EC tablet Take 1 tablet (75 mg total) by mouth 2 (two) times daily. 30 tablet 0   No facility-administered medications prior to visit.     No Known Allergies  ROS As per HPI  PE: Blood pressure 131/82, pulse 75, temperature 97.6 F (36.4 C), temperature source Temporal, resp. rate 16, weight 228 lb (103.4 kg), SpO2 95 %. Gen: Alert, well appearing.  Patient is oriented to person, place, time, and situation. L spine: ROM fully intact w/out  pain. No L/S spine tenderness to palpation. No muscle spasms palpable. LE strength 5/5 prox/dist bilat.  Sitting SLR neg bilat. Patellar DTRs 1+ bilat.  LABS:  none  IMPRESSION AND PLAN:  Acute low back musculoskeletal strain: improving significantly over the last 1-2 days. Continue stretching, heat application, continue diclofenac bid x 1 more week with food. May also continue flexeril 10mg  tid prn. He has had this happen a few times now, and he asks for a few pain pills to have on hand in case a future episode gives such incapacitating pain again.  I gave rx for vicodin 5/325, 1-2 q6h prn, #10.   Therapeutic expectations and side effect profile of medication discussed today.  Patient's questions answered.  An After Visit Summary was printed and given to the patient.  FOLLOW UP: Return if symptoms worsen or fail to improve.  Signed:  Crissie Sickles, MD           06/11/2016

## 2016-06-11 NOTE — Progress Notes (Signed)
Pre visit review using our clinic review tool, if applicable. No additional management support is needed unless otherwise documented below in the visit note. 

## 2016-06-16 ENCOUNTER — Encounter: Payer: 59 | Admitting: Family Medicine

## 2016-07-26 ENCOUNTER — Other Ambulatory Visit: Payer: Self-pay | Admitting: Family Medicine

## 2017-01-23 ENCOUNTER — Encounter: Payer: Self-pay | Admitting: Family Medicine

## 2017-02-12 DIAGNOSIS — E221 Hyperprolactinemia: Secondary | ICD-10-CM | POA: Diagnosis not present

## 2017-02-12 DIAGNOSIS — D352 Benign neoplasm of pituitary gland: Secondary | ICD-10-CM | POA: Diagnosis not present

## 2017-06-23 IMAGING — DX DG CHEST 2V
2 series · 2 of 2 positions shown · non-contrast
Comparison: Chest radiograph performed 03/31/2006

CLINICAL DATA: Acute onset of generalized chest discomfort and
pain. Initial encounter.

EXAM:
CHEST  2 VIEW

[chest pa]
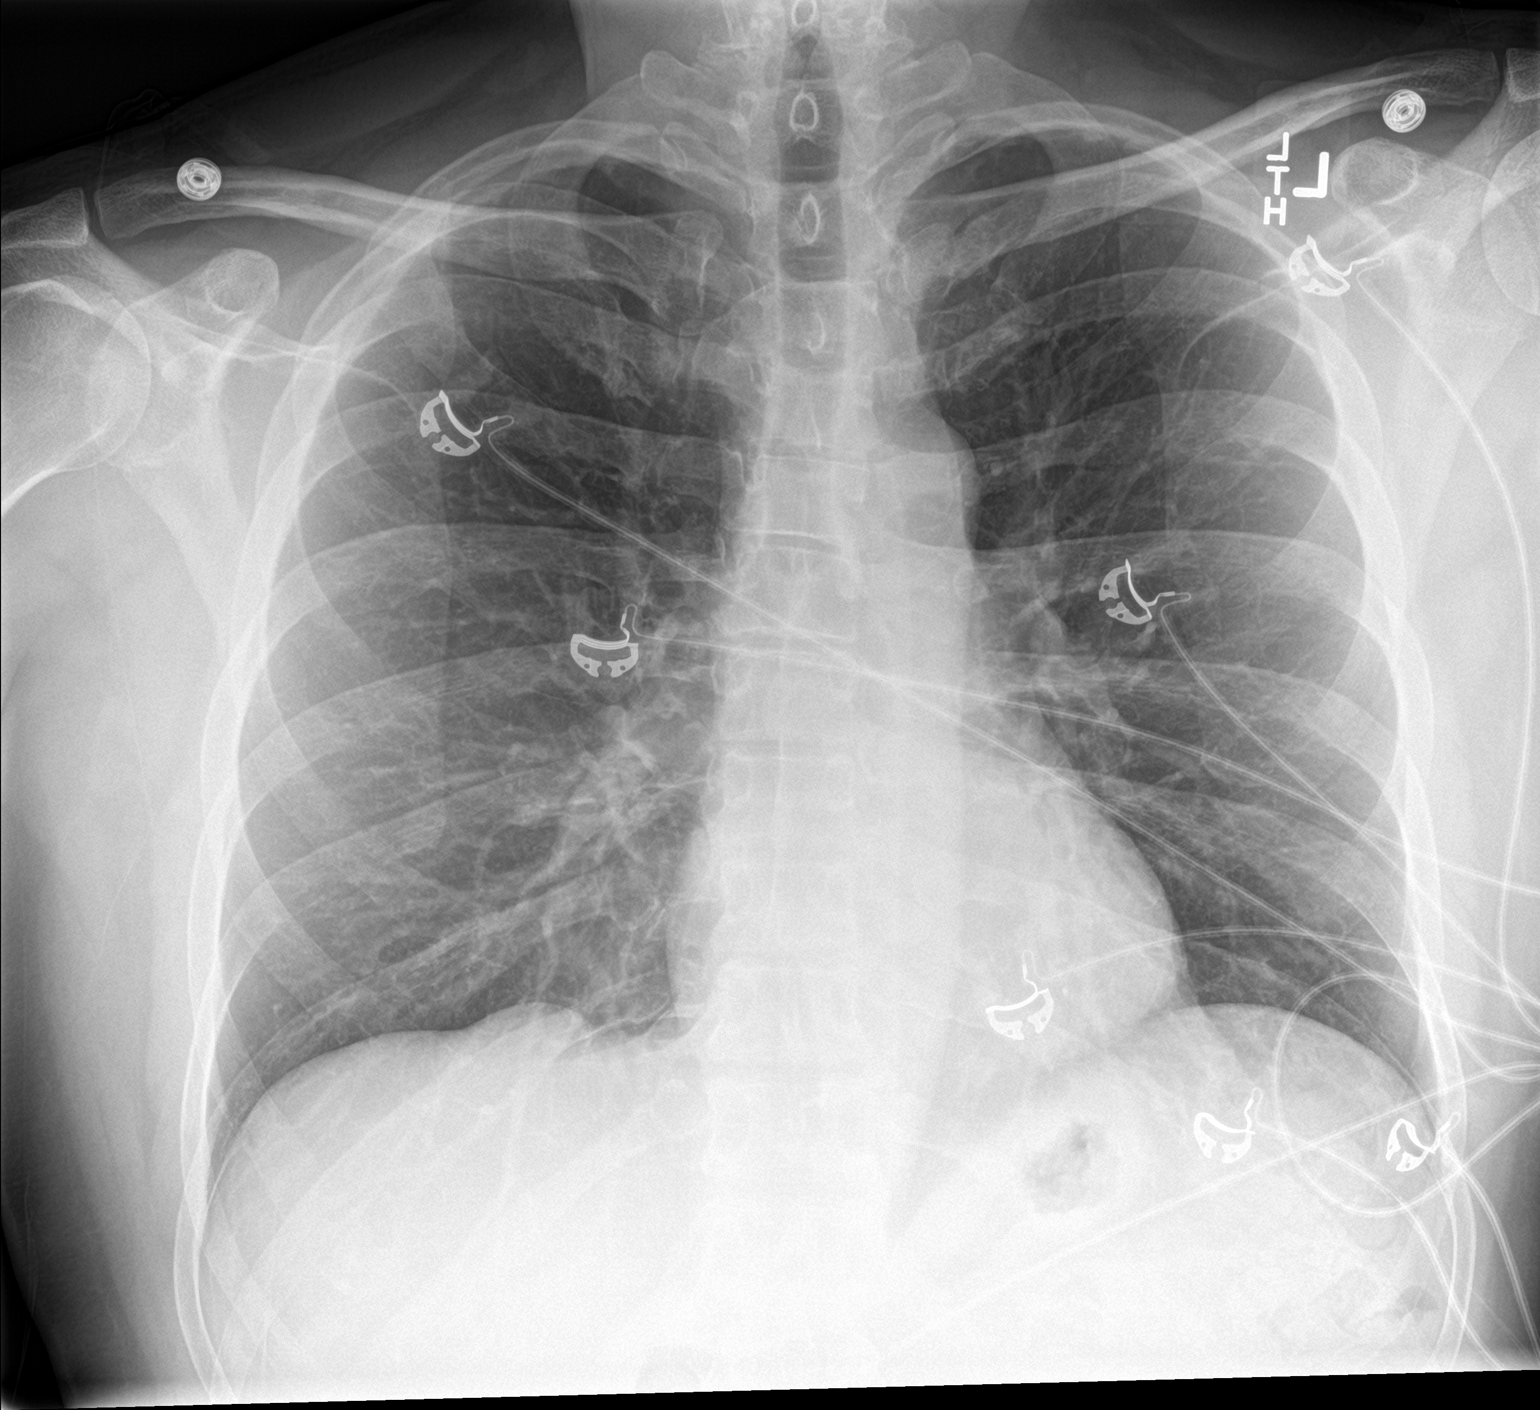

[chest lat]
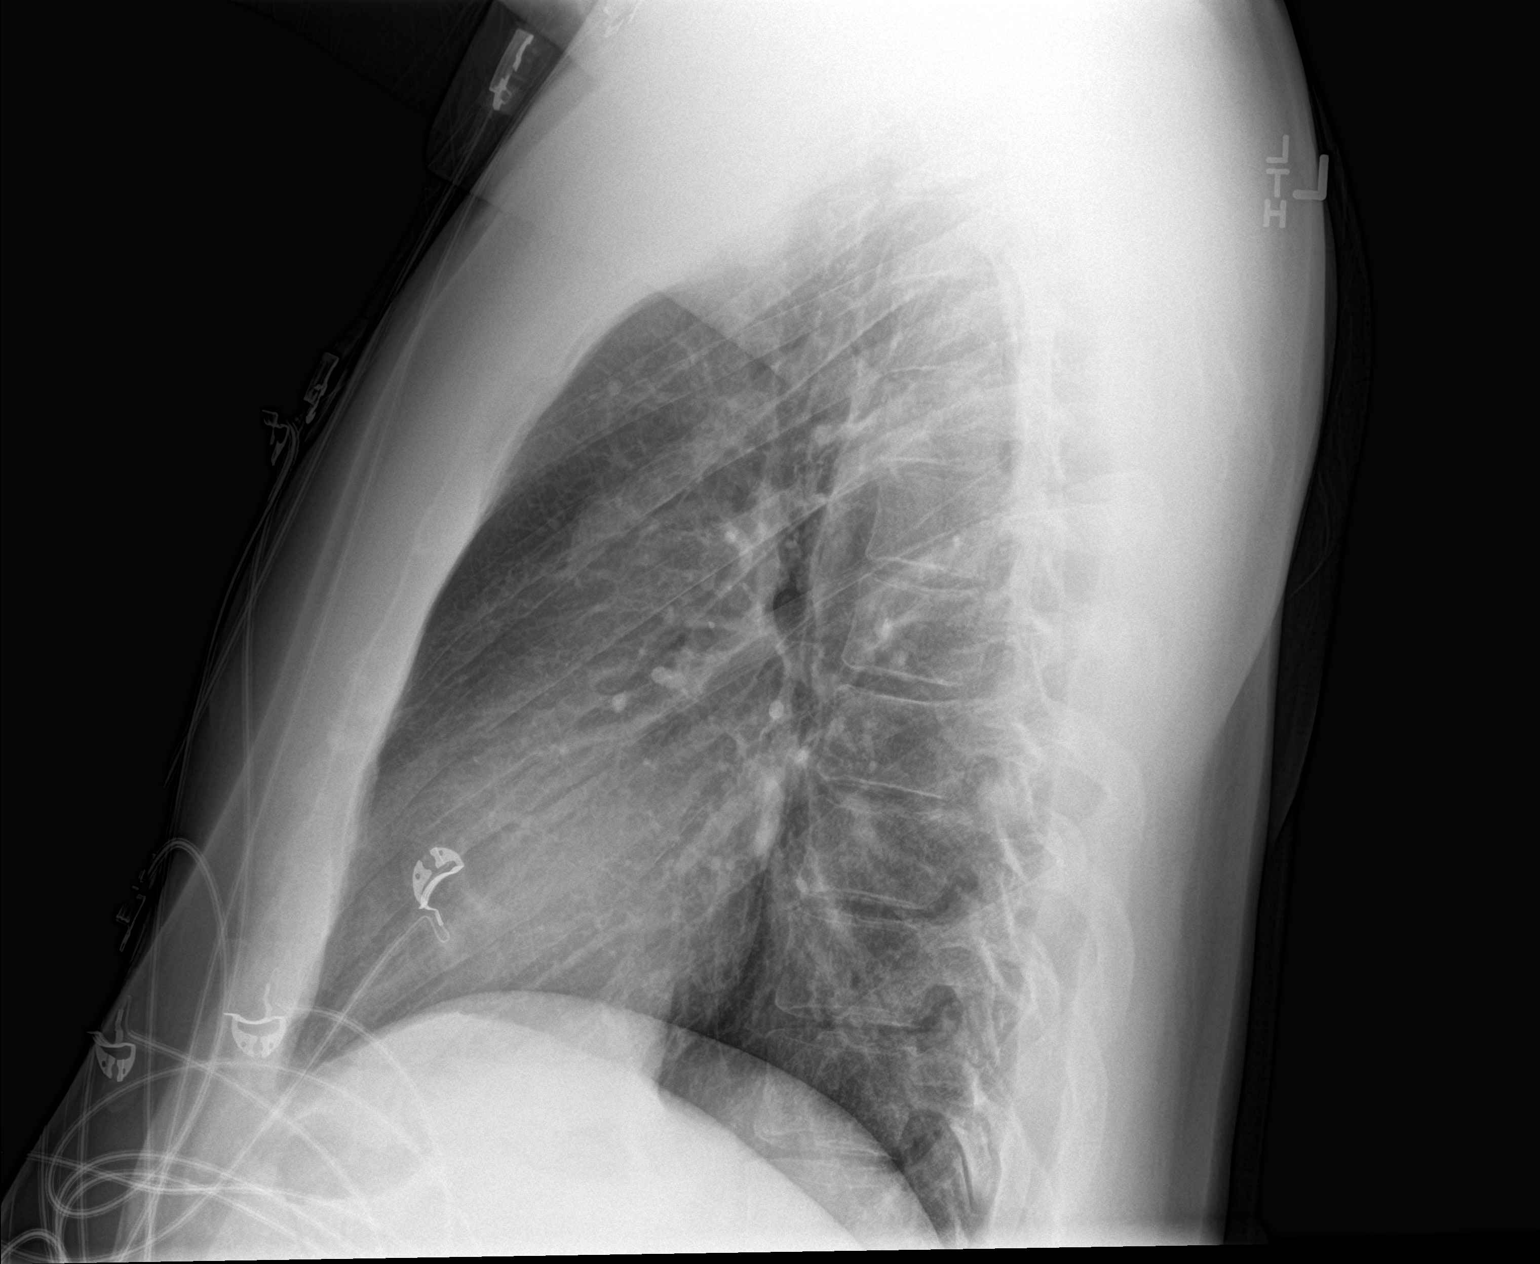

[2 of 2 positions shown; findings below may reference images not displayed]

FINDINGS: The lungs are well-aerated. Pulmonary vascularity is at the upper
limits of normal. There is no evidence of focal opacification,
pleural effusion or pneumothorax.

The heart is normal in size; the mediastinal contour is within
normal limits. No acute osseous abnormalities are seen.
IMPRESSION: No acute cardiopulmonary process seen.

## 2017-10-25 ENCOUNTER — Other Ambulatory Visit: Payer: Self-pay | Admitting: Family Medicine

## 2017-10-27 NOTE — Telephone Encounter (Signed)
MyChart message read.

## 2017-12-19 ENCOUNTER — Other Ambulatory Visit: Payer: Self-pay | Admitting: Family Medicine

## 2017-12-21 NOTE — Telephone Encounter (Signed)
Since I haven't seen him for dep/anx in over a year, I'll deny this RF and will RF when I see him on 12/29/17.-thx

## 2017-12-21 NOTE — Telephone Encounter (Signed)
Contacted patient.  He hasn't been seen in over a year but he made an acute appointment with you for knee problem 12/29/17 @ 8am.  He said he has enough of this until after his appointment.  Please advise RF.

## 2017-12-29 ENCOUNTER — Encounter: Payer: Self-pay | Admitting: Family Medicine

## 2017-12-29 ENCOUNTER — Ambulatory Visit (INDEPENDENT_AMBULATORY_CARE_PROVIDER_SITE_OTHER): Payer: 59 | Admitting: Family Medicine

## 2017-12-29 VITALS — BP 128/76 | HR 55 | Temp 97.9°F | Resp 16 | Ht 73.0 in | Wt 215.4 lb

## 2017-12-29 DIAGNOSIS — F411 Generalized anxiety disorder: Secondary | ICD-10-CM | POA: Diagnosis not present

## 2017-12-29 DIAGNOSIS — E7800 Pure hypercholesterolemia, unspecified: Secondary | ICD-10-CM | POA: Diagnosis not present

## 2017-12-29 DIAGNOSIS — F410 Panic disorder [episodic paroxysmal anxiety] without agoraphobia: Secondary | ICD-10-CM

## 2017-12-29 DIAGNOSIS — M25562 Pain in left knee: Secondary | ICD-10-CM

## 2017-12-29 DIAGNOSIS — Z23 Encounter for immunization: Secondary | ICD-10-CM

## 2017-12-29 DIAGNOSIS — S83422A Sprain of lateral collateral ligament of left knee, initial encounter: Secondary | ICD-10-CM | POA: Diagnosis not present

## 2017-12-29 LAB — COMPREHENSIVE METABOLIC PANEL
ALT: 12 U/L (ref 0–53)
AST: 15 U/L (ref 0–37)
Albumin: 4.4 g/dL (ref 3.5–5.2)
Alkaline Phosphatase: 70 U/L (ref 39–117)
BUN: 19 mg/dL (ref 6–23)
CALCIUM: 9.5 mg/dL (ref 8.4–10.5)
CHLORIDE: 106 meq/L (ref 96–112)
CO2: 27 mEq/L (ref 19–32)
CREATININE: 1.03 mg/dL (ref 0.40–1.50)
GFR: 79.93 mL/min (ref >60.00)
Glucose, Bld: 107 mg/dL — ABNORMAL HIGH (ref 70–99)
POTASSIUM: 4.4 meq/L (ref 3.5–5.1)
Sodium: 140 mEq/L (ref 135–145)
Total Bilirubin: 0.7 mg/dL (ref 0.2–1.2)
Total Protein: 6.8 g/dL (ref 6.0–8.3)

## 2017-12-29 LAB — LIPID PANEL
CHOL/HDL RATIO: 5
Cholesterol: 216 mg/dL — ABNORMAL HIGH (ref 0–200)
HDL: 45 mg/dL (ref >39.00)
LDL CALC: 154 mg/dL — AB (ref 0–99)
NonHDL: 170.6
TRIGLYCERIDES: 83 mg/dL (ref 0.0–149.0)
VLDL: 16.6 mg/dL (ref 0.0–40.0)

## 2017-12-29 MED ORDER — CITALOPRAM HYDROBROMIDE 20 MG PO TABS: 20.0000 mg | ORAL_TABLET | Freq: Every day | ORAL | 3 refills | Status: DC

## 2017-12-29 NOTE — Progress Notes (Signed)
OFFICE VISIT  12/29/2017   CC:  Chief Complaint  Patient presents with  . Follow-up    RCI, pt is fasting.  . Knee Pain    left   HPI:    Patient is a 52 y.o.  male who presents for left knee pain and f/u panic disorder and hypercholesterolemia.  LEFT KNEE: onset a couple months ago, pain in lateral aspect of L patella, some tenderness to touch, feeling of "pulling" in the area when he bends and rotates it.  Intermittent sharp pain in same area.  No pain with walking or running, no swelling or redness. No trauma or strain recalled.  No similar prob in the past. Trying to ice it lately.  No otc meds tried.   Pain the same now as when it started.   Panic d/o: has been well maintained/stable on citalopram and prn clonaz for a few years now. Takes clonazepam rarely.  Still doing well on daily cital. New job/merger--tough issues lately--feels like he's dealing with this well!  Hx of hypercholesterolemia: was on meds at one point but then self d/c'd it and has been working on TLC.  ROS: no CP, no SOB, no wheezing, no cough, no dizziness, no HAs, no rashes, no melena/hematochezia.  No polyuria or polydipsia.  No myalgias or arthralgias.   Past Medical History:  Diagnosis Date  . Atypical chest pain 2007; 2016   2007 stress test neg per pt  . Hyperlipidemia    med rx'd but pt eventually became noncompliant  . Panic disorder   . 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 CARDIOVASCULAR STRESS TEST  2007   Neg per pt report    Outpatient Medications Prior to Visit  Medication Sig Dispense Refill  . cabergoline (DOSTINEX) 0.5 MG tablet Take 0.5 mg by mouth 2 (two) times a week.    . citalopram (CELEXA) 20 MG tablet TAKE 1 TABLET BY MOUTH  DAILY 90 tablet 0  . clonazePAM (KLONOPIN) 0.5 MG tablet 1-2 tabs po bid prn anxiety 90 tablet 1  . cyclobenzaprine (FLEXERIL) 10 MG tablet Take 1 tablet (10 mg total) by mouth 3 (three) times  daily as needed for muscle spasms. 15 tablet 1  . diclofenac (VOLTAREN) 75 MG EC tablet 1 tab po bid x 2 weeks for low back pain (Patient not taking: Reported on 12/29/2017) 30 tablet 1  . HYDROcodone-acetaminophen (NORCO/VICODIN) 5-325 MG tablet Take 1 tablet by mouth every 6 (six) hours as needed for moderate pain. (Patient not taking: Reported on 12/29/2017) 10 tablet 0   No facility-administered medications prior to visit.     No Known Allergies  ROS As per HPI  PE: Blood pressure 128/76, pulse (!) 55, temperature 97.9 F (36.6 C), temperature source Oral, resp. rate 16, height 6\' 1"  (1.854 m), weight 215 lb 6 oz (97.7 kg), SpO2 98 %. Body mass index is 28.42 kg/m.  Gen: Alert, well appearing.  Patient is oriented to person, place, time, and situation. AFFECT: pleasant, lucid thought and speech. L knee: no swelling, erythema, or warmth.  ROM fully intact, with minimal pulling sensation in lateral aspect of knee in the joint line/over insertion point of lat collat ligament.  No pain with varus/valgus force, no laxity. Lachmann's neg, post drawer neg.  Patellar grind neg, no excessive patellar laxity.  McMurray's neg. The only positive finding is TTP over anterior aspect of lateral joint line and a bit distal  to this.   LABS:    Chemistry      Component Value Date/Time   NA 140 06/14/2015 1001   K 4.1 06/14/2015 1001   CL 103 06/14/2015 1001   CO2 28 06/14/2015 1001   BUN 17 06/14/2015 1001   CREATININE 1.00 06/14/2015 1001      Component Value Date/Time   CALCIUM 9.8 06/14/2015 1001   ALKPHOS 71 06/14/2015 1001   AST 24 06/14/2015 1001   ALT 24 06/14/2015 1001   BILITOT 0.7 06/14/2015 1001     Lab Results  Component Value Date   CHOL 236 (H) 06/14/2015   HDL 54.60 06/14/2015   LDLCALC 162 (H) 06/14/2015   TRIG 99.0 06/14/2015   CHOLHDL 4 06/14/2015    IMPRESSION AND PLAN:  1) Left knee pain; hx and exam most consistent with mild sprain of LCL. Lateral meniscus  injury is another possibility but he has NO pain with walking or running, no hx of swelling, no hx of acute strain/traumatic injury. No red flags for bony/intra-articular pathology.  Plan: Ice your knee for 20 min twice per day. Take TWO over the counter aleve tabs twice per day with food for 10 days. Do home stretches/strengthening-pt's wife is an Therapist, sports with [facility] agency and pt says he can get some PT via the physical therapists she works with.  2) Anxiety, hx of panic: doing very well on long term tx with citalopram 20mg  qd. Rarely has to use clonazepam.  3) Hypercholesterolemia: continue TLC efforts and recheck FLP and CMET  today.  An After Visit Summary was printed and given to the patient.  FOLLOW UP: Return in about 6 months (around 07/01/2018) for annual CPE (fasting).  Signed:  Crissie Sickles, MD           12/29/2017

## 2017-12-29 NOTE — Patient Instructions (Signed)
Ice your knee for 20 min twice per day.  Take TWO over the counter aleve tabs twice per day with food for 10 days.  Do home stretches/strengthening.

## 2017-12-30 ENCOUNTER — Encounter: Payer: Self-pay | Admitting: Family Medicine

## 2018-03-16 ENCOUNTER — Other Ambulatory Visit: Payer: Self-pay | Admitting: Family Medicine

## 2018-03-16 MED ORDER — CITALOPRAM HYDROBROMIDE 20 MG PO TABS: 20.0000 mg | ORAL_TABLET | Freq: Every day | ORAL | 0 refills | Status: DC

## 2018-03-16 NOTE — Telephone Encounter (Signed)
Copied from [place] [id number]. Topic: Quick Communication - Rx Refill/Question >> Mar 16, 2018  9:56 AM Yvette Rack wrote: Medication: citalopram (CELEXA) 20 MG tablet  Has the patient contacted their pharmacy? no  Preferred Pharmacy (with phone number or street name): [place], [place] [phone] (Phone) [phone] (Fax)  Agent: Please be advised that RX refills may take up to 3 business days. We ask that you follow-up with your pharmacy.

## 2018-03-16 NOTE — Telephone Encounter (Signed)
Requested Prescriptions  Pending Prescriptions Disp Refills  . citalopram (CELEXA) 20 MG tablet 90 tablet 0    Sig: Take 1 tablet (20 mg total) by mouth daily.     Psychiatry:  Antidepressants - SSRI Passed - 03/16/2018 10:04 AM      Passed - Valid encounter within last 6 months    Recent Outpatient Visits          2 months ago Sprain of lateral collateral ligament of left knee, initial encounter   [place], Adrian Blackwater, MD   1 year ago Strain of lumbar region, initial encounter   [place], Adrian Blackwater, MD   2 years ago Panic disorder   [place], Adrian Blackwater, MD   2 years ago Health maintenance examination   [facility] [place], Adrian Blackwater, MD   2 years ago Panic disorder   [facility] [place], Adrian Blackwater, MD      Future Appointments            In 3 months [place], Adrian Blackwater, MD [place], [facility]

## 2018-05-08 ENCOUNTER — Ambulatory Visit: Admit: 2018-05-08 | Discharge: 2018-05-08 | Payer: PRIVATE HEALTH INSURANCE

## 2018-05-08 ENCOUNTER — Ambulatory Visit

## 2018-05-08 DIAGNOSIS — J069 Acute upper respiratory infection, unspecified: Secondary | ICD-10-CM

## 2018-05-08 MED ORDER — PREDNISONE 20 MG TAB
20 mg | ORAL_TABLET | Freq: Two times a day (BID) | ORAL | 0 refills | Status: AC
Start: 2018-05-08 — End: 2018-05-13

## 2018-05-08 MED ORDER — FLUTICASONE 50 MCG/ACTUATION NASAL SPRAY, SUSP
50 mcg/actuation | Freq: Every day | NASAL | 0 refills | Status: AC
Start: 2018-05-08 — End: ?

## 2018-05-08 NOTE — Progress Notes (Signed)
Sinus Pain   This is a new problem. The current episode started more than 2 days ago. The problem occurs constantly. Pertinent negatives include no headaches. Associated symptoms comments: C/o facial pressure- post nasal drainage. The symptoms are aggravated by bending. Nothing relieves the symptoms. He has tried nothing for the symptoms.        No past medical history on file.     Past Surgical History:   Procedure Laterality Date   ??? ABDOMEN SURGERY PROC UNLISTED      hernia repair         No family history on file.     Social History     Socioeconomic History   ??? Marital status: MARRIED     Spouse name: Not on file   ??? Number of children: Not on file   ??? Years of education: Not on file   ??? Highest education level: Not on file   Occupational History   ??? Not on file   Social Needs   ??? Financial resource strain: Not on file   ??? Food insecurity:     Worry: Not on file     Inability: Not on file   ??? Transportation needs:     Medical: Not on file     Non-medical: Not on file   Tobacco Use   ??? Smoking status: Never Smoker   ??? Smokeless tobacco: Never Used   Substance and Sexual Activity   ??? Alcohol use: Not on file   ??? Drug use: Not on file   ??? Sexual activity: Not on file   Lifestyle   ??? Physical activity:     Days per week: Not on file     Minutes per session: Not on file   ??? Stress: Not on file   Relationships   ??? Social connections:     Talks on phone: Not on file     Gets together: Not on file     Attends religious service: Not on file     Active member of club or organization: Not on file     Attends meetings of clubs or organizations: Not on file     Relationship status: Not on file   ??? Intimate partner violence:     Fear of current or ex partner: Not on file     Emotionally abused: Not on file     Physically abused: Not on file     Forced sexual activity: Not on file   Other Topics Concern   ??? Not on file   Social History Narrative   ??? Not on file                ALLERGIES: Patient has no known  allergies.    Review of Systems   HENT: Positive for congestion, postnasal drip, sinus pressure and sinus pain. Negative for sneezing and sore throat.    Respiratory: Negative for cough.    Neurological: Negative for headaches.   All other systems reviewed and are negative.      Vitals:    05/08/18 1601   BP: 126/68   Pulse: 72   Resp: 16   Temp: 98.1 ??F (36.7 ??C)   SpO2: 100%   Weight: 175 lb (79.4 kg)   Height: 5\' 7"  (1.702 m)       Physical Exam  Vitals signs and nursing note reviewed.   Constitutional:       General: He is not in acute distress.  HENT:  Right Ear: Tympanic membrane and ear canal normal.      Left Ear: Tympanic membrane and ear canal normal.      Nose: Nose normal.      Mouth/Throat:      Pharynx: No oropharyngeal exudate or posterior oropharyngeal erythema.   Eyes:      General:         Right eye: No discharge.         Left eye: No discharge.      Conjunctiva/sclera: Conjunctivae normal.   Neck:      Musculoskeletal: Neck supple.   Pulmonary:      Effort: Pulmonary effort is normal. No respiratory distress.      Breath sounds: Normal breath sounds. No wheezing or rales.   Lymphadenopathy:      Cervical: No cervical adenopathy.   Skin:     Findings: No rash.         MDM    Procedures        ICD-10-CM ICD-9-CM    1. Viral upper respiratory tract infection J06.9 465.9    2. Post-nasal drainage R09.82 473.9    Fluids/ gargles  Claritin/ allegra   Tylenol cold-sinus - max strength 1-2 tab 4 times/ day    with Advil as needed        Medications Ordered Today   Medications   ??? fluticasone propionate (FLONASE) 50 mcg/actuation nasal spray     Sig: 2 Sprays by Both Nostrils route daily.     Dispense:  1 Bottle     Refill:  0   ??? predniSONE (DELTASONE) 20 mg tablet     Sig: Take 20 mg by mouth two (2) times a day for 5 days. With food     Dispense:  10 Tab     Refill:  0     No results found for any visits on 05/08/18.  The patients condition was discussed with the patient and they understand.  The  patient is to follow up with primary care doctor.  If signs and symptoms become worse the pt is to go to the ER. The patient is to take medications as prescribed.

## 2018-05-08 NOTE — Progress Notes (Signed)
Sinus Pain   This is a new problem. The current episode started more than 2 days ago. The problem occurs constantly. Pertinent negatives include no headaches. Associated symptoms comments: C/o facial pressure- post nasal drainage. The symptoms are aggravated by bending. Nothing relieves the symptoms. He has tried nothing for the symptoms.        No past medical history on file.     Past Surgical History:   Procedure Laterality Date   ??? ABDOMEN SURGERY PROC UNLISTED      hernia repair         No family history on file.     Social History     Socioeconomic History   ??? Marital status: MARRIED     Spouse name: Not on file   ??? Number of children: Not on file   ??? Years of education: Not on file   ??? Highest education level: Not on file   Occupational History   ??? Not on file   Social Needs   ??? Financial resource strain: Not on file   ??? Food insecurity:     Worry: Not on file     Inability: Not on file   ??? Transportation needs:     Medical: Not on file     Non-medical: Not on file   Tobacco Use   ??? Smoking status: Never Smoker   ??? Smokeless tobacco: Never Used   Substance and Sexual Activity   ??? Alcohol use: Not on file   ??? Drug use: Not on file   ??? Sexual activity: Not on file   Lifestyle   ??? Physical activity:     Days per week: Not on file     Minutes per session: Not on file   ??? Stress: Not on file   Relationships   ??? Social connections:     Talks on phone: Not on file     Gets together: Not on file     Attends religious service: Not on file     Active member of club or organization: Not on file     Attends meetings of clubs or organizations: Not on file     Relationship status: Not on file   ??? Intimate partner violence:     Fear of current or ex partner: Not on file     Emotionally abused: Not on file     Physically abused: Not on file     Forced sexual activity: Not on file   Other Topics Concern   ??? Not on file   Social History Narrative   ??? Not on file                ALLERGIES: Patient has no known allergies.     Review of Systems   HENT: Positive for congestion, postnasal drip, sinus pressure and sinus pain. Negative for sneezing and sore throat.    Respiratory: Negative for cough.    Neurological: Negative for headaches.   All other systems reviewed and are negative.      Vitals:    05/08/18 1601   BP: 126/68   Pulse: 72   Resp: 16   Temp: 98.1 ??F (36.7 ??C)   SpO2: 100%   Weight: 175 lb (79.4 kg)   Height: 5\' 7"  (1.702 m)       Physical Exam  Vitals signs and nursing note reviewed.   Constitutional:       General: He is not in acute distress.  HENT:  Right Ear: Tympanic membrane and ear canal normal.      Left Ear: Tympanic membrane and ear canal normal.      Nose: Nose normal.      Mouth/Throat:      Pharynx: No oropharyngeal exudate or posterior oropharyngeal erythema.   Eyes:      General:         Right eye: No discharge.         Left eye: No discharge.      Conjunctiva/sclera: Conjunctivae normal.   Neck:      Musculoskeletal: Neck supple.   Pulmonary:      Effort: Pulmonary effort is normal. No respiratory distress.      Breath sounds: Normal breath sounds. No wheezing or rales.   Lymphadenopathy:      Cervical: No cervical adenopathy.   Skin:     Findings: No rash.         MDM    Procedures        ICD-10-CM ICD-9-CM    1. Viral upper respiratory tract infection J06.9 465.9    2. Post-nasal drainage R09.82 473.9    Fluids/ gargles  Claritin/ allegra   Tylenol cold-sinus - max strength 1-2 tab 4 times/ day    with Advil as needed        Medications Ordered Today   Medications   ??? fluticasone propionate (FLONASE) 50 mcg/actuation nasal spray     Sig: 2 Sprays by Both Nostrils route daily.     Dispense:  1 Bottle     Refill:  0   ??? predniSONE (DELTASONE) 20 mg tablet     Sig: Take 20 mg by mouth two (2) times a day for 5 days. With food     Dispense:  10 Tab     Refill:  0     No results found for any visits on 05/08/18.  The patients condition was discussed with the patient and they understand.   The patient is to follow up with primary care doctor.  If signs and symptoms become worse the pt is to go to the ER. The patient is to take medications as prescribed.

## 2018-05-08 NOTE — Patient Instructions (Addendum)
Fluids/ gargles  Claritin/ allegra   Tylenol cold-sinus - max strength 1-2 tab 4 times/ day    with Advil as needed           Saline Nasal Washes: Care Instructions  Your Care Instructions  Saline nasal washes help keep the nasal passages open by washing out thick or dried mucus. This simple remedy can help relieve symptoms of allergies, sinusitis, and colds. It also can make the nose feel more comfortable by keeping the mucous membranes moist. You may notice a little burning sensation in your nose the first few times you use the solution, but this usually gets better in a few days.  Follow-up care is a key part of your treatment and safety. Be sure to make and go to all appointments, and call your doctor if you are having problems. It's also a good idea to know your test results and keep a list of the medicines you take.  How can you care for yourself at home?  ?? You can buy premixed saline solution in a squeeze bottle or other sinus rinse products at a drugstore. Read and follow the instructions on the label.  ?? You also can make your own saline solution by adding 1 teaspoon of salt and 1 teaspoon of baking soda to 2 cups of distilled water.  ?? If you use a homemade solution, pour a small amount into a clean bowl. Using a rubber bulb syringe, squeeze the syringe and place the tip in the salt water. Pull a small amount of the salt water into the syringe by relaxing your hand.  ?? Sit down with your head tilted slightly back. Do not lie down. Put the tip of the bulb syringe or the squeeze bottle a little way into one of your nostrils. Gently drip or squirt a few drops into the nostril. Repeat with the other nostril. Some sneezing and gagging are normal at first.  ?? Gently blow your nose.  ?? Wipe the syringe or bottle tip clean after each use.  ?? Repeat this 2 or 3 times a day.  ?? Use nasal washes gently if you have nosebleeds often.  When should you call for help?   Watch closely for changes in your health, and be sure to contact your doctor if:  ?? ?? You often get nosebleeds.   ?? ?? You have problems doing the nasal washes.   Where can you learn more?  Go to http://www.healthwise.net/GoodHelpConnections.  Enter B784 in the search box to learn more about "Saline Nasal Washes: Care Instructions."  Current as of: March 01, 2017  Content Version: 12.2  ?? 2006-2019 [employer]. Care instructions adapted under license by [employer] (which disclaims liability or warranty for this information). If you have questions about a medical condition or this instruction, always ask your healthcare professional. [employer] disclaims any warranty or liability for your use of this information.         Viral Respiratory Infection: Care Instructions  Your Care Instructions    Viruses are very small organisms. They grow in number after they enter your body. There are many types that cause different illnesses, such as colds and the mumps.  The symptoms of a viral respiratory infection often start quickly. They include a fever, sore throat, and runny nose. You may also just not feel well. Or you may not want to eat much.  Most viral respiratory infections are not serious. They usually get better with time and self-care.    Antibiotics are not used to treat a viral infection. That's because antibiotics will not help cure a viral illness. In some cases, antiviral medicine can help your body fight a serious viral infection.  Follow-up care is a key part of your treatment and safety. Be sure to make and go to all appointments, and call your doctor if you are having problems. It's also a good idea to know your test results and keep a list of the medicines you take.  How can you care for yourself at home?  ?? Rest as much as possible until you feel better.  ?? Be safe with medicines. Take your medicine exactly as prescribed. Call  your doctor if you think you are having a problem with your medicine. You will get more details on the specific medicine your doctor prescribes.  ?? Take an over-the-counter pain medicine, such as acetaminophen (Tylenol), ibuprofen (Advil, Motrin), or naproxen (Aleve), as needed for pain and fever. Read and follow all instructions on the label. Do not give aspirin to anyone younger than 20. It has been linked to Reye syndrome, a serious illness.  ?? Drink plenty of fluids, enough so that your urine is light yellow or clear like water. Hot fluids, such as tea or soup, may help relieve congestion in your nose and throat. If you have kidney, heart, or liver disease and have to limit fluids, talk with your doctor before you increase the amount of fluids you drink.  ?? Try to clear mucus from your lungs by breathing deeply and coughing.  ?? Gargle with warm salt water once an hour. This can help reduce swelling and throat pain. Use 1 teaspoon of salt mixed in 1 cup of warm water.  ?? Do not smoke or allow others to smoke around you. If you need help quitting, talk to your doctor about stop-smoking programs and medicines. These can increase your chances of quitting for good.  To avoid spreading the virus  ?? Cough or sneeze into a tissue. Then throw the tissue away.  ?? If you don't have a tissue, use your hand to cover your cough or sneeze. Then clean your hand. You can also cough into your sleeve.  ?? Wash your hands often. Use soap and warm water. Wash for 15 to 20 seconds each time.  ?? If you don't have soap and water near you, you can clean your hands with alcohol wipes or gel.  When should you call for help?  Call your doctor now or seek immediate medical care if:  ?? ?? You have a new or higher fever.   ?? ?? Your fever lasts more than 48 hours.   ?? ?? You have trouble breathing.   ?? ?? You have a fever with a stiff neck or a severe headache.   ?? ?? You are sensitive to light.   ?? ?? You feel very sleepy or confused.    ??Watch closely for changes in your health, and be sure to contact your doctor if:  ?? ?? You do not get better as expected.   Where can you learn more?  Go to http://www.healthwise.net/GoodHelpConnections.  Enter Q795 in the search box to learn more about "Viral Respiratory Infection: Care Instructions."  Current as of: October 18, 2017  Content Version: 12.2  ?? 2006-2019 [employer]. Care instructions adapted under license by [employer] (which disclaims liability or warranty for this information). If you have questions about a medical condition or this instruction, always ask   your healthcare professional. [employer] disclaims any warranty or liability for your use of this information.

## 2018-05-22 ENCOUNTER — Other Ambulatory Visit: Payer: Self-pay | Admitting: Family Medicine

## 2018-07-01 ENCOUNTER — Ambulatory Visit (INDEPENDENT_AMBULATORY_CARE_PROVIDER_SITE_OTHER): Payer: 59 | Admitting: Family Medicine

## 2018-07-01 ENCOUNTER — Encounter: Payer: Self-pay | Admitting: Family Medicine

## 2018-07-01 ENCOUNTER — Encounter (INDEPENDENT_AMBULATORY_CARE_PROVIDER_SITE_OTHER): Payer: Self-pay | Admitting: *Deleted

## 2018-07-01 VITALS — BP 106/71 | HR 59 | Temp 97.4°F | Resp 16 | Ht 74.0 in | Wt 229.8 lb

## 2018-07-01 DIAGNOSIS — E7800 Pure hypercholesterolemia, unspecified: Secondary | ICD-10-CM

## 2018-07-01 DIAGNOSIS — Z23 Encounter for immunization: Secondary | ICD-10-CM

## 2018-07-01 DIAGNOSIS — D352 Benign neoplasm of pituitary gland: Secondary | ICD-10-CM | POA: Diagnosis not present

## 2018-07-01 DIAGNOSIS — Z125 Encounter for screening for malignant neoplasm of prostate: Secondary | ICD-10-CM | POA: Diagnosis not present

## 2018-07-01 DIAGNOSIS — Z0000 Encounter for general adult medical examination without abnormal findings: Secondary | ICD-10-CM

## 2018-07-01 DIAGNOSIS — Z1211 Encounter for screening for malignant neoplasm of colon: Secondary | ICD-10-CM

## 2018-07-01 LAB — CBC WITH DIFFERENTIAL/PLATELET
BASOS PCT: 0.9 % (ref 0.0–3.0)
Basophils Absolute: 0.1 10*3/uL (ref 0.0–0.1)
EOS PCT: 6.2 % — AB (ref 0.0–5.0)
Eosinophils Absolute: 0.4 10*3/uL (ref 0.0–0.7)
HCT: 46.1 % (ref 39.0–52.0)
Hemoglobin: 15.6 g/dL (ref 13.0–17.0)
Lymphocytes Relative: 29.3 % (ref 12.0–46.0)
Lymphs Abs: 1.7 10*3/uL (ref 0.7–4.0)
MCHC: 34 g/dL (ref 30.0–36.0)
MCV: 89.6 fl (ref 78.0–100.0)
MONO ABS: 0.5 10*3/uL (ref 0.1–1.0)
Monocytes Relative: 8.1 % (ref 3.0–12.0)
Neutro Abs: 3.3 10*3/uL (ref 1.4–7.7)
Neutrophils Relative %: 55.5 % (ref 43.0–77.0)
Platelets: 237 10*3/uL (ref 150.0–400.0)
RBC: 5.15 Mil/uL (ref 4.22–5.81)
RDW: 13.8 % (ref 11.5–15.5)
WBC: 5.9 10*3/uL (ref 4.0–10.5)

## 2018-07-01 LAB — TSH: TSH: 2.17 u[IU]/mL (ref 0.35–4.50)

## 2018-07-01 LAB — LIPID PANEL
CHOLESTEROL: 234 mg/dL — AB (ref 0–200)
HDL: 49.4 mg/dL (ref >39.00)
LDL Cholesterol: 157 mg/dL — ABNORMAL HIGH (ref 0–99)
NONHDL: 184.67
Total CHOL/HDL Ratio: 5
Triglycerides: 137 mg/dL (ref 0.0–149.0)
VLDL: 27.4 mg/dL (ref 0.0–40.0)

## 2018-07-01 LAB — COMPREHENSIVE METABOLIC PANEL
ALT: 18 U/L (ref 0–53)
AST: 20 U/L (ref 0–37)
Albumin: 4.7 g/dL (ref 3.5–5.2)
Alkaline Phosphatase: 72 U/L (ref 39–117)
BUN: 21 mg/dL (ref 6–23)
CO2: 29 mEq/L (ref 19–32)
CREATININE: 1.07 mg/dL (ref 0.40–1.50)
Calcium: 9.5 mg/dL (ref 8.4–10.5)
Chloride: 101 mEq/L (ref 96–112)
GFR: 71.84 mL/min (ref >60.00)
GLUCOSE: 85 mg/dL (ref 70–99)
Potassium: 4.3 mEq/L (ref 3.5–5.1)
SODIUM: 139 meq/L (ref 135–145)
TOTAL PROTEIN: 7.2 g/dL (ref 6.0–8.3)
Total Bilirubin: 0.6 mg/dL (ref 0.2–1.2)

## 2018-07-01 LAB — PSA: PSA: 0.35 ng/mL (ref 0.10–4.00)

## 2018-07-01 MED ORDER — CITALOPRAM HYDROBROMIDE 20 MG PO TABS: 20.0000 mg | ORAL_TABLET | Freq: Every day | ORAL | 1 refills | Status: DC

## 2018-07-01 NOTE — Progress Notes (Signed)
Office Note 07/01/2018  CC:  Chief Complaint  Patient presents with  . Annual Exam    pt is fasting    HPI:  Billy Alvarez is a 53 y.o. male who is here for annual health maintenance exam. Has pituitary adenoma (macroprolactinoma); most recent f/u in EMR with Dr. Quay Burow at [facility] regarding this dx was 02/2017; all stable at that time, no changes made. He has f/u with them again tomorrow morning.  Still working with [employer].  The company merger has been tough.  He is thinking about early retirement. Citalopram helping still. Clonazepam: RARE use of this med, still has most of last rx I gave him.  Exercise: none Long hours at work, etc. Diet: not working on anything in particular.   Past Medical History:  Diagnosis Date  . Atypical chest pain 2007; 2016   2007 stress test neg per pt  . Hyperlipidemia    med rx'd but pt eventually became noncompliant, preferred TLC approach.  Numbers stable 2018 and 2019.  Marland Kitchen Panic disorder   . 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 CARDIOVASCULAR STRESS TEST  2007   Neg per pt report    Family History  Problem Relation Age of Onset  . Heart disease Father   . Heart attack Paternal Grandmother   . Cancer Neg Hx   . Diabetes Neg Hx     Social History   Socioeconomic History  . Marital status: Married    Spouse name: Not on file  . Number of children: Not on file  . Years of education: Not on file  . Highest education level: Not on file  Occupational History  . Not on file  Social Needs  . Financial resource strain: Not on file  . Food insecurity:    Worry: Not on file    Inability: Not on file  . Transportation needs:    Medical: Not on file    Non-medical: Not on file  Tobacco Use  . Smoking status: Never Smoker  . Smokeless tobacco: Never Used  Substance and Sexual Activity  . Alcohol use: No  . Drug use: No  . Sexual activity: Not on file  Lifestyle  .  Physical activity:    Days per week: Not on file    Minutes per session: Not on file  . Stress: Not on file  Relationships  . Social connections:    Talks on phone: Not on file    Gets together: Not on file    Attends religious service: Not on file    Active member of club or organization: Not on file    Attends meetings of clubs or organizations: Not on file    Relationship status: Not on file  . Intimate partner violence:    Fear of current or ex partner: Not on file    Emotionally abused: Not on file    Physically abused: Not on file    Forced sexual activity: Not on file  Other Topics Concern  . Not on file  Social History Narrative   Married, 1 adult daughter, 1 adult son.   Educ: HS   Living in [place].   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   No T/A/Ds.    Outpatient Medications Prior to Visit  Medication Sig Dispense Refill  . cabergoline (DOSTINEX) 0.5 MG tablet Take 0.5 mg by mouth 2 (two) times a week.    . citalopram (CELEXA)  20 MG tablet TAKE 1 TABLET BY MOUTH  DAILY 90 tablet 0  . clonazePAM (KLONOPIN) 0.5 MG tablet 1-2 tabs po bid prn anxiety (Patient not taking: Reported on 07/01/2018) 90 tablet 1  . cyclobenzaprine (FLEXERIL) 10 MG tablet Take 1 tablet (10 mg total) by mouth 3 (three) times daily as needed for muscle spasms. (Patient not taking: Reported on 07/01/2018) 15 tablet 1  . diclofenac (VOLTAREN) 75 MG EC tablet 1 tab po bid x 2 weeks for low back pain (Patient not taking: Reported on 12/29/2017) 30 tablet 1  . HYDROcodone-acetaminophen (NORCO/VICODIN) 5-325 MG tablet Take 1 tablet by mouth every 6 (six) hours as needed for moderate pain. (Patient not taking: Reported on 12/29/2017) 10 tablet 0   No facility-administered medications prior to visit.     No Known Allergies  ROS Review of Systems  Constitutional: Negative for appetite change, chills, fatigue and fever.  HENT: Negative for congestion, dental problem, ear pain and sore throat.    Eyes: Negative for discharge, redness and visual disturbance.  Respiratory: Negative for cough, chest tightness, shortness of breath and wheezing.   Cardiovascular: Negative for chest pain, palpitations and leg swelling.  Gastrointestinal: Negative for abdominal pain, blood in stool, diarrhea, nausea and vomiting.  Genitourinary: Negative for difficulty urinating, dysuria, flank pain, frequency, hematuria and urgency.  Musculoskeletal: Negative for arthralgias, back pain, joint swelling, myalgias and neck stiffness.  Skin: Negative for pallor and rash.  Neurological: Negative for dizziness, speech difficulty, weakness and headaches.  Hematological: Negative for adenopathy. Does not bruise/bleed easily.  Psychiatric/Behavioral: Negative for confusion and sleep disturbance. The patient is not nervous/anxious.     PE; Blood pressure 106/71, pulse (!) 59, temperature (!) 97.4 F (36.3 C), temperature source Oral, resp. rate 16, height 6\' 2"  (1.88 m), weight 229 lb 12.8 oz (104.2 kg), SpO2 100 %. Body mass index is 29.5 kg/m.  Gen: Alert, well appearing.  Patient is oriented to person, place, time, and situation. AFFECT: pleasant, lucid thought and speech. ENT: Ears: EACs clear, normal epithelium.  TMs with good light reflex and landmarks bilaterally.  Eyes: no injection, icteris, swelling, or exudate.  EOMI, PERRLA. Nose: no drainage or turbinate edema/swelling.  No injection or focal lesion.  Mouth: lips without lesion/swelling.  Oral mucosa pink and moist.  Dentition intact and without obvious caries or gingival swelling.  Oropharynx without erythema, exudate, or swelling.  Neck: supple/nontender.  No LAD, mass, or TM.  Carotid pulses 2+ bilaterally, without bruits. CV: RRR, no m/r/g.   LUNGS: CTA bilat, nonlabored resps, good aeration in all lung fields. ABD: soft, NT, ND, BS normal.  No hepatospenomegaly or mass.  No bruits. EXT: no clubbing, cyanosis, or edema.  Musculoskeletal: no joint  swelling, erythema, warmth, or tenderness.  ROM of all joints intact. Skin - no sores or suspicious lesions or rashes or color changes Rectal exam: negative without mass, lesions or tenderness, PROSTATE EXAM: smooth and symmetric without nodules or tenderness.   Pertinent labs:  Lab Results  Component Value Date   TSH 1.76 06/14/2015   Lab Results  Component Value Date   WBC 5.9 06/14/2015   HGB 15.2 06/14/2015   HCT 46.2 06/14/2015   MCV 90.0 06/14/2015   PLT 276.0 06/14/2015   Lab Results  Component Value Date   CREATININE 1.03 12/29/2017   BUN 19 12/29/2017   NA 140 12/29/2017   K 4.4 12/29/2017   CL 106 12/29/2017   CO2 27 12/29/2017   Lab Results  Component Value Date   ALT 12 12/29/2017   AST 15 12/29/2017   ALKPHOS 70 12/29/2017   BILITOT 0.7 12/29/2017   Lab Results  Component Value Date   CHOL 216 (H) 12/29/2017   Lab Results  Component Value Date   HDL 45.00 12/29/2017   Lab Results  Component Value Date   LDLCALC 154 (H) 12/29/2017   Lab Results  Component Value Date   TRIG 83.0 12/29/2017   Lab Results  Component Value Date   CHOLHDL 5 12/29/2017   Lab Results  Component Value Date   PSA 0.41 06/14/2015    ASSESSMENT AND PLAN:   Health maintenance exam: Reviewed age and gender appropriate health maintenance issues (prudent diet, regular exercise, health risks of tobacco and excessive alcohol, use of seatbelts, fire alarms in home, use of sunscreen).  Also reviewed age and gender appropriate health screening as well as vaccine recommendations. Vaccines: Tdap--> pt declined.  Shingrix--> # 1 today. Labs: fasting HP + PSA. Prostate ca screening:  DRE normal today, PSA. Colon ca screening: cologuard neg 10/2015.  Referred pt for colonoscopy today.  Anxiety level and mood stable on citalopram 20mg  qd.  RARE use of clonazepam. RF'd citalopram today.  An After Visit Summary was printed and given to the patient.  FOLLOW UP:  Return in about  6 months (around 12/30/2018) for routine chronic illness f/u.  Signed:  Crissie Sickles, MD           07/01/2018

## 2018-07-01 NOTE — Patient Instructions (Signed)

## 2018-07-01 NOTE — Addendum Note (Signed)
Addended by: Deveron Furlong D on: 07/01/2018 10:27 AM   Modules accepted: Orders

## 2018-08-14 ENCOUNTER — Other Ambulatory Visit: Payer: Self-pay | Admitting: Family Medicine

## 2018-11-23 ENCOUNTER — Encounter: Payer: Self-pay | Admitting: Family Medicine

## 2018-11-30 MED ORDER — CITALOPRAM HYDROBROMIDE 20 MG PO TABS: 20.0000 mg | ORAL_TABLET | Freq: Every day | ORAL | 1 refills | Status: DC

## 2018-11-30 NOTE — Telephone Encounter (Signed)
OK, citalopram eRx'd to [facility]

## 2018-12-20 ENCOUNTER — Encounter: Payer: Self-pay | Admitting: Family Medicine

## 2018-12-22 NOTE — Telephone Encounter (Signed)
Reassure pt. Ok to wait to discuss when he comes in for his appt. The loss of taste he mentions is very UNLIKELY covid -related. -thx

## 2018-12-30 ENCOUNTER — Encounter (INDEPENDENT_AMBULATORY_CARE_PROVIDER_SITE_OTHER): Payer: Self-pay | Admitting: *Deleted

## 2018-12-30 ENCOUNTER — Ambulatory Visit (INDEPENDENT_AMBULATORY_CARE_PROVIDER_SITE_OTHER): Payer: 59 | Admitting: Family Medicine

## 2018-12-30 ENCOUNTER — Other Ambulatory Visit: Payer: Self-pay

## 2018-12-30 ENCOUNTER — Encounter: Payer: Self-pay | Admitting: Family Medicine

## 2018-12-30 VITALS — BP 107/72 | HR 60 | Temp 98.1°F | Resp 16 | Ht 74.0 in | Wt 229.8 lb

## 2018-12-30 DIAGNOSIS — F411 Generalized anxiety disorder: Secondary | ICD-10-CM

## 2018-12-30 DIAGNOSIS — Z23 Encounter for immunization: Secondary | ICD-10-CM | POA: Diagnosis not present

## 2018-12-30 DIAGNOSIS — Z1211 Encounter for screening for malignant neoplasm of colon: Secondary | ICD-10-CM

## 2018-12-30 DIAGNOSIS — F410 Panic disorder [episodic paroxysmal anxiety] without agoraphobia: Secondary | ICD-10-CM

## 2018-12-30 NOTE — Progress Notes (Signed)
OFFICE VISIT  12/30/2018   CC:  Chief Complaint  Patient presents with  . Follow-up    RCI, pt is fasting   HPI:    Patient is a 53 y.o. Caucasian male who presents for 6 mo f/u anxiety/ panic disorder. He has hx of pituitary microadenoma (prolactinoma) and is on cabergoline, followed by [facility].  Doing well regarding panic/anxiety.  Occ tries to ween cital but gets worse anxiety so he plans on indefinite use of this med.  RARE use of alprazolam, does not need RF of this med today.  Has had 1 mo of dry mouth, chapped lips, signif sensitivity to temp, some loss/change in sense of taste. No change in smell sense.  No fever, cough, aching, fatigue, HA, ST, runny nose, or nasal congestion. Feeling well.  Past Medical History:  Diagnosis Date  . Atypical chest pain 2007; 2016   2007 stress test neg per pt  . Colon cancer screening    cologuard neg 2017.  Referred pt for screening colonoscopy 06/2018.  Marland Kitchen Hyperlipidemia    med rx'd but pt eventually became noncompliant, preferred TLC approach.  Numbers stable 2018 and 2019.  Marland Kitchen Panic disorder   . 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 CARDIOVASCULAR STRESS TEST  2007   Neg per pt report    Outpatient Medications Prior to Visit  Medication Sig Dispense Refill  . cabergoline (DOSTINEX) 0.5 MG tablet Take 0.5 mg by mouth 2 (two) times a week.    . citalopram (CELEXA) 20 MG tablet Take 1 tablet (20 mg total) by mouth daily. 90 tablet 1  . clonazePAM (KLONOPIN) 0.5 MG tablet 1-2 tabs po bid prn anxiety (Patient not taking: Reported on 07/01/2018) 90 tablet 1  . cyclobenzaprine (FLEXERIL) 10 MG tablet Take 1 tablet (10 mg total) by mouth 3 (three) times daily as needed for muscle spasms. (Patient not taking: Reported on 07/01/2018) 15 tablet 1  . diclofenac (VOLTAREN) 75 MG EC tablet 1 tab po bid x 2 weeks for low back pain (Patient not taking: Reported on 12/29/2017) 30 tablet 1  .  HYDROcodone-acetaminophen (NORCO/VICODIN) 5-325 MG tablet Take 1 tablet by mouth every 6 (six) hours as needed for moderate pain. (Patient not taking: Reported on 12/29/2017) 10 tablet 0   No facility-administered medications prior to visit.     No Known Allergies  ROS As per HPI  PE: Blood pressure 107/72, pulse 60, temperature 98.1 F (36.7 C), temperature source Temporal, resp. rate 16, height 6\' 2"  (1.88 m), weight 229 lb 12.8 oz (104.2 kg), SpO2 97 %. Gen: Alert, well appearing.  Patient is oriented to person, place, time, and situation. AFFECT: pleasant, lucid thought and speech. ENT: Ears:  Eyes: no injection, icteris, swelling, or exudate.  EOMI, PERRLA. Nose: no drainage or turbinate edema/swelling.  No injection or focal lesion.  Mouth: lips without lesion/swelling.  Oral mucosa pink and moist.  Dentition intact and without obvious caries or gingival swelling.  Oropharynx without erythema, exudate, or swelling.   Neck: no LAD or tenderness.   LABS:    Chemistry      Component Value Date/Time   NA 139 07/01/2018 0854   K 4.3 07/01/2018 0854   CL 101 07/01/2018 0854   CO2 29 07/01/2018 0854   BUN 21 07/01/2018 0854   CREATININE 1.07 07/01/2018 0854      Component Value Date/Time   CALCIUM 9.5 07/01/2018 0854  ALKPHOS 72 07/01/2018 0854   AST 20 07/01/2018 0854   ALT 18 07/01/2018 0854   BILITOT 0.6 07/01/2018 0854     Lab Results  Component Value Date   CHOL 234 (H) 07/01/2018   HDL 49.40 07/01/2018   LDLCALC 157 (H) 07/01/2018   TRIG 137.0 07/01/2018   CHOLHDL 5 07/01/2018   Lab Results  Component Value Date   WBC 5.9 07/01/2018   HGB 15.6 07/01/2018   HCT 46.1 07/01/2018   MCV 89.6 07/01/2018   PLT 237.0 07/01/2018   Lab Results  Component Value Date   TSH 2.17 07/01/2018    IMPRESSION AND PLAN:  1) GAD/panic disorder: The current medical regimen is effective;  continue present plan and medications.  2) Oral sensory changes x 1 mo: normal exam.   I reassured pt.  Watchful waiting approach. Reassured pt that I do not think this is anything related to coronavirus.  3) Preventative health: Flu vaccine and Shingrix #2 today.  New referral for colonoscopy to Dr. Laural Golden today.  An After Visit Summary was printed and given to the patient.  FOLLOW UP: Return in about 6 months (around 07/02/2019) for annual CPE (fasting).  Signed:  Crissie Sickles, MD           12/30/2018

## 2018-12-30 NOTE — Addendum Note (Signed)
Addended by: Marlene Bast A on: 12/30/2018 08:39 AM   Modules accepted: Orders

## 2018-12-31 ENCOUNTER — Encounter: Payer: Self-pay | Admitting: Family Medicine

## 2019-05-02 ENCOUNTER — Other Ambulatory Visit (INDEPENDENT_AMBULATORY_CARE_PROVIDER_SITE_OTHER): Payer: Self-pay | Admitting: *Deleted

## 2019-05-02 DIAGNOSIS — Z1211 Encounter for screening for malignant neoplasm of colon: Secondary | ICD-10-CM | POA: Insufficient documentation

## 2019-05-15 ENCOUNTER — Encounter: Payer: Self-pay | Admitting: Family Medicine

## 2019-05-16 ENCOUNTER — Encounter: Payer: Self-pay | Admitting: *Deleted

## 2019-05-16 ENCOUNTER — Encounter: Payer: Self-pay | Admitting: Family Medicine

## 2019-05-16 ENCOUNTER — Ambulatory Visit (INDEPENDENT_AMBULATORY_CARE_PROVIDER_SITE_OTHER): Payer: 59 | Admitting: Internal Medicine

## 2019-05-16 ENCOUNTER — Other Ambulatory Visit: Payer: Self-pay

## 2019-05-16 DIAGNOSIS — J069 Acute upper respiratory infection, unspecified: Secondary | ICD-10-CM

## 2019-05-16 NOTE — Progress Notes (Signed)
Subjective:    Patient ID: Billy Alvarez, male    DOB: 26-Feb-1966, 54 y.o.   MRN: [id number]  DOS:  05/16/2019 Type of visit - description: Attempted  to make this a video visit, due to technical difficulties from the patient side it was not possible  thus we proceeded with a Virtual Visit via Telephone    I connected with above mentioned patient  by telephone and verified that I am speaking with the correct person using two identifiers.  THIS ENCOUNTER IS A VIRTUAL VISIT DUE TO COVID-19 - PATIENT WAS NOT SEEN IN THE OFFICE. PATIENT HAS CONSENTED TO VIRTUAL VISIT / TELEMEDICINE VISIT   Location of patient: home  Location of provider: office  I discussed the limitations, risks, security and privacy concerns of performing an evaluation and management service by telephone and the availability of in person appointments. I also discussed with the patient that there may be a patient responsible charge related to this service. The patient expressed understanding and agreed to proceed.   History of Present Illness: Acute Symptoms of started 2 days ago: Nasal congestion, no other symptoms. He completed a questionnaire from his job this morning and he was advised to quarantine for possible COVID-19 or get a letter from the doctor clearing him.   His son has similar symptoms, just a runny nose without any other symptoms. The patient's wife is a Marine scientist, she brought home a fast Covid test and it was negative.    Review of Systems Denies cough, chest pain, difficulty breathing No nausea, vomiting, diarrhea No itchy eyes or itchy nose.  Past Medical History:  Diagnosis Date  . Atypical chest pain 2007; 2016   2007 stress test neg per pt  . Colon cancer screening    cologuard neg 2017.  Referred pt for screening colonoscopy 06/2018.  Marland Kitchen Hyperlipidemia    med rx'd but pt eventually became noncompliant, preferred TLC approach.  Numbers stable 2018 and 2019.  Marland Kitchen Panic disorder   . Pituitary tumor  2011   Microadenoma    Past Surgical History:  Procedure Laterality Date  . CARDIOVASCULAR STRESS TEST  2007   Neg per pt report    Social History   Socioeconomic History  . Marital status: Married    Spouse name: Not on file  . Number of children: Not on file  . Years of education: Not on file  . Highest education level: Not on file  Occupational History  . Not on file  Tobacco Use  . Smoking status: Never Smoker  . Smokeless tobacco: Never Used  Substance and Sexual Activity  . Alcohol use: No  . Drug use: No  . Sexual activity: Not on file  Other Topics Concern  . Not on file  Social History Narrative   Married, 1 adult daughter, 1 adult son.   Educ: HS   Living in [place].   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   No T/A/Ds.   Social Determinants of Health   Financial Resource Strain:   . Difficulty of Paying Living Expenses: Not on file  Food Insecurity:   . Worried About Charity fundraiser in the Last Year: Not on file  . Ran Out of Food in the Last Year: Not on file  Transportation Needs:   . Lack of Transportation (Medical): Not on file  . Lack of Transportation (Non-Medical): Not on file  Physical Activity:   . Days of Exercise per Week: Not on file  . Minutes  of Exercise per Session: Not on file  Stress:   . Feeling of Stress : Not on file  Social Connections:   . Frequency of Communication with Friends and Family: Not on file  . Frequency of Social Gatherings with Friends and Family: Not on file  . Attends Religious Services: Not on file  . Active Member of Clubs or Organizations: Not on file  . Attends Archivist Meetings: Not on file  . Marital Status: Not on file  Intimate Partner Violence:   . Fear of Current or Ex-Partner: Not on file  . Emotionally Abused: Not on file  . Physically Abused: Not on file  . Sexually Abused: Not on file      Allergies as of 05/16/2019   No Known Allergies     Medication List        Accurate as of May 16, 2019  1:20 PM. If you have any questions, ask your nurse or doctor.        cabergoline 0.5 MG tablet Commonly known as: DOSTINEX Take 0.5 mg by mouth 2 (two) times a week.   citalopram 20 MG tablet Commonly known as: CELEXA Take 1 tablet (20 mg total) by mouth daily.   clonazePAM 0.5 MG tablet Commonly known as: KLONOPIN 1-2 tabs po bid prn anxiety           Objective:   Physical Exam There were no vitals taken for this visit. We attempted video visit, I saw him briefly, he look healthy, there was no audio so we switched to a phone  virtual visit.  He is alert oriented x3, no apparent distress, voice is normal.       Assessment    54 year old male, PMH includes a panic disorder, history of a pituitary tumor [place] with  URI: Symptoms as described above, needs clearance to go back to work. He had a fast Covid test which was negative. Recommend to get PCR test at our facility in [facility] or at a [employer]. If the PCR test is negative he is cleared to go back to work. Also, recommend to call anytime if he gets sick with fever, chills, or other symptoms consistent with Covid.     I discussed the assessment and treatment plan with the patient. The patient was provided an opportunity to ask questions and all were answered. The patient agreed with the plan and demonstrated an understanding of the instructions.   The patient was advised to call back or seek an in-person evaluation if the symptoms worsen or if the condition fails to improve as anticipated.  I provided 20  minutes of non-face-to-face time during this encounter.  Kathlene November, MD

## 2019-05-17 ENCOUNTER — Other Ambulatory Visit: Payer: Self-pay

## 2019-05-17 ENCOUNTER — Other Ambulatory Visit: Payer: 59

## 2019-05-17 ENCOUNTER — Ambulatory Visit: Payer: 59 | Attending: Internal Medicine

## 2019-05-17 DIAGNOSIS — Z20822 Contact with and (suspected) exposure to covid-19: Secondary | ICD-10-CM

## 2019-05-18 LAB — NOVEL CORONAVIRUS, NAA: SARS-CoV-2, NAA: NOT DETECTED

## 2019-05-19 ENCOUNTER — Encounter: Payer: Self-pay | Admitting: Internal Medicine

## 2019-05-20 ENCOUNTER — Encounter: Payer: Self-pay | Admitting: Internal Medicine

## 2019-05-28 ENCOUNTER — Encounter: Payer: Self-pay | Admitting: Family Medicine

## 2019-05-31 ENCOUNTER — Other Ambulatory Visit: Payer: Self-pay | Admitting: Family Medicine

## 2019-06-15 ENCOUNTER — Encounter (INDEPENDENT_AMBULATORY_CARE_PROVIDER_SITE_OTHER): Payer: Self-pay | Admitting: *Deleted

## 2019-06-15 ENCOUNTER — Telehealth (INDEPENDENT_AMBULATORY_CARE_PROVIDER_SITE_OTHER): Payer: Self-pay | Admitting: *Deleted

## 2019-06-15 ENCOUNTER — Other Ambulatory Visit (INDEPENDENT_AMBULATORY_CARE_PROVIDER_SITE_OTHER): Payer: Self-pay | Admitting: *Deleted

## 2019-06-15 DIAGNOSIS — Z1211 Encounter for screening for malignant neoplasm of colon: Secondary | ICD-10-CM

## 2019-06-15 NOTE — Telephone Encounter (Signed)
Patient needs suprep (copay card) TCS sch'd 3/26

## 2019-06-16 MED ORDER — SUPREP BOWEL PREP KIT 17.5-3.13-1.6 GM/177ML PO SOLN: 1.0000 | Freq: Once | ORAL | 0 refills | Status: AC

## 2019-06-30 ENCOUNTER — Encounter (HOSPITAL_COMMUNITY): Payer: Self-pay

## 2019-06-30 ENCOUNTER — Ambulatory Visit (HOSPITAL_COMMUNITY): Admit: 2019-06-30 | Payer: 59 | Admitting: Internal Medicine

## 2019-06-30 SURGERY — COLONOSCOPY
Anesthesia: Moderate Sedation

## 2019-07-07 ENCOUNTER — Other Ambulatory Visit: Payer: Self-pay

## 2019-07-07 ENCOUNTER — Ambulatory Visit (INDEPENDENT_AMBULATORY_CARE_PROVIDER_SITE_OTHER): Payer: Self-pay

## 2019-07-07 ENCOUNTER — Telehealth (INDEPENDENT_AMBULATORY_CARE_PROVIDER_SITE_OTHER): Payer: Self-pay | Admitting: *Deleted

## 2019-07-07 NOTE — Telephone Encounter (Signed)
Referring MD/PCP: [email or web address]   Procedure: tcs with propofol Patient request - Dr Laural Golden was ok with   Reason/Indication:  screening  Has patient had this procedure before?  no  If so, when, by whom and where?    Is there a family history of colon cancer?  no  Who?  What age when diagnosed?    Is patient diabetic?   no      Does patient have prosthetic heart valve or mechanical valve?  no  Do you have a pacemaker/defibrillator?  no  Has patient ever had endocarditis/atrial fibrillation? no  Does patient use oxygen? no  Has patient had joint replacement within last 12 months?  no  Is patient constipated or do they take laxatives? no  Does patient have a history of alcohol/drug use?  no  Is patient on blood thinner such as Coumadin, Plavix and/or Aspirin? no  Medications: cabergoline 0.5 mg 2 tab twice weekly, celexa 20 mg daily  Allergies: nkda  Medication Adjustment per Dr Laural Golden:   Procedure date & time: 08/05/19

## 2019-07-09 NOTE — Telephone Encounter (Signed)
Colonoscopy with propofol. 

## 2019-07-12 ENCOUNTER — Other Ambulatory Visit (INDEPENDENT_AMBULATORY_CARE_PROVIDER_SITE_OTHER): Payer: Self-pay | Admitting: *Deleted

## 2019-08-03 ENCOUNTER — Other Ambulatory Visit: Payer: Self-pay

## 2019-08-03 ENCOUNTER — Encounter (HOSPITAL_COMMUNITY)
Admission: RE | Admit: 2019-08-03 | Discharge: 2019-08-03 | Disposition: A | Discharge status: Home / Self Care | Payer: 59 | Source: Ambulatory Visit | Attending: Internal Medicine | Admitting: Internal Medicine

## 2019-08-03 ENCOUNTER — Other Ambulatory Visit (HOSPITAL_COMMUNITY)
Admission: RE | Admit: 2019-08-03 | Discharge: 2019-08-03 | Disposition: A | Discharge status: Home / Self Care | Payer: 59 | Source: Ambulatory Visit | Attending: Internal Medicine | Admitting: Internal Medicine

## 2019-08-03 DIAGNOSIS — Z1211 Encounter for screening for malignant neoplasm of colon: Secondary | ICD-10-CM | POA: Diagnosis not present

## 2019-08-03 DIAGNOSIS — F410 Panic disorder [episodic paroxysmal anxiety] without agoraphobia: Secondary | ICD-10-CM | POA: Diagnosis not present

## 2019-08-03 DIAGNOSIS — Z20822 Contact with and (suspected) exposure to covid-19: Secondary | ICD-10-CM | POA: Diagnosis not present

## 2019-08-03 DIAGNOSIS — K644 Residual hemorrhoidal skin tags: Secondary | ICD-10-CM | POA: Diagnosis not present

## 2019-08-03 LAB — SARS CORONAVIRUS 2 (TAT 6-24 HRS): SARS Coronavirus 2: NEGATIVE

## 2019-08-05 ENCOUNTER — Ambulatory Visit (HOSPITAL_COMMUNITY): Payer: 59 | Admitting: Anesthesiology

## 2019-08-05 ENCOUNTER — Encounter (HOSPITAL_COMMUNITY)
Admission: RE | Disposition: A | Discharge status: Home / Self Care | Payer: Self-pay | Source: Home / Self Care | Attending: Internal Medicine

## 2019-08-05 ENCOUNTER — Ambulatory Visit (HOSPITAL_COMMUNITY)
Admission: RE | Admit: 2019-08-05 | Discharge: 2019-08-05 | Disposition: A | Discharge status: Home / Self Care | Payer: 59 | Attending: Internal Medicine | Admitting: Internal Medicine

## 2019-08-05 DIAGNOSIS — Z1211 Encounter for screening for malignant neoplasm of colon: Secondary | ICD-10-CM | POA: Insufficient documentation

## 2019-08-05 DIAGNOSIS — K644 Residual hemorrhoidal skin tags: Secondary | ICD-10-CM

## 2019-08-05 DIAGNOSIS — F410 Panic disorder [episodic paroxysmal anxiety] without agoraphobia: Secondary | ICD-10-CM | POA: Insufficient documentation

## 2019-08-05 DIAGNOSIS — Z20822 Contact with and (suspected) exposure to covid-19: Secondary | ICD-10-CM | POA: Insufficient documentation

## 2019-08-05 HISTORY — PX: COLONOSCOPY WITH PROPOFOL: SHX5780

## 2019-08-05 SURGERY — COLONOSCOPY WITH PROPOFOL
Anesthesia: General

## 2019-08-05 MED ORDER — KETAMINE HCL 10 MG/ML IJ SOLN
INTRAMUSCULAR | Status: DC | PRN
  Administered 2019-08-05: 15 mg via INTRAVENOUS

## 2019-08-05 MED ORDER — PROPOFOL 500 MG/50ML IV EMUL
INTRAVENOUS | Status: DC | PRN
  Administered 2019-08-05: 100 ug/kg/min via INTRAVENOUS
  Administered 2019-08-05: 200 ug/kg/min via INTRAVENOUS

## 2019-08-05 MED ORDER — LIDOCAINE 2% (20 MG/ML) 5 ML SYRINGE
INTRAMUSCULAR | Status: AC
  Filled 2019-08-05: qty 5

## 2019-08-05 MED ORDER — PROPOFOL 10 MG/ML IV BOLUS
INTRAVENOUS | Status: AC
  Filled 2019-08-05: qty 80

## 2019-08-05 MED ORDER — PROPOFOL 10 MG/ML IV BOLUS
INTRAVENOUS | Status: DC | PRN
  Administered 2019-08-05: 60 mg via INTRAVENOUS

## 2019-08-05 MED ORDER — KETAMINE HCL 50 MG/5ML IJ SOSY
PREFILLED_SYRINGE | INTRAMUSCULAR | Status: AC
  Filled 2019-08-05: qty 5

## 2019-08-05 MED ORDER — LACTATED RINGERS IV SOLN: INTRAVENOUS | Status: DC

## 2019-08-05 NOTE — H&P (Signed)
Billy Alvarez is an 54 y.o. male.   Chief Complaint: Patient is here for colonoscopy. HPI: Patient is 54 year old Caucasian male who is here for screening colonoscopy.  This is patient's first exam.  He denies abdominal pain change in bowel habits or rectal bleeding.  He does not take OTC NSAIDs. Family history is negative for CRC.  Past Medical History:  Diagnosis Date  . Atypical chest pain 2007; 2016   2007 stress test neg per pt  . Colon cancer screening    cologuard neg 2017.  Referred pt for screening colonoscopy 06/2018.  Marland Kitchen Hyperlipidemia    med rx'd but pt eventually became noncompliant, preferred TLC approach.  Numbers stable 2018 and 2019.  Marland Kitchen Panic disorder   . 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 CARDIOVASCULAR STRESS TEST  2007   Neg per pt report    Family History  Problem Relation Age of Onset  . Heart disease Father   . Heart attack Paternal Grandmother   . Cancer Neg Hx   . Diabetes Neg Hx    Social History:  reports that he has never smoked. He has never used smokeless tobacco. He reports that he does not drink alcohol or use drugs.  Allergies: No Known Allergies  Medications Prior to Admission  Medication Sig Dispense Refill  . cabergoline (DOSTINEX) 0.5 MG tablet Take 0.5 mg by mouth 2 (two) times a week.    . citalopram (CELEXA) 20 MG tablet TAKE 1 TABLET BY MOUTH  DAILY (Patient taking differently: Take 20 mg by mouth daily. ) 90 tablet 0  . clonazePAM (KLONOPIN) 0.5 MG tablet 1-2 tabs po bid prn anxiety (Patient taking differently: Take 0.5 mg by mouth 2 (two) times daily as needed for anxiety. ) 90 tablet 1    No results found for this or any previous visit (from the past 48 hour(s)). No results found.  Review of Systems  Blood pressure 127/73, pulse 65, temperature 98.6 F (37 C), resp. rate 16, SpO2 98 %. Physical Exam  Constitutional: He appears well-developed and well-nourished.   HENT:  Mouth/Throat: Oropharynx is clear and moist.  Eyes: Conjunctivae are normal. No scleral icterus.  Neck: No thyromegaly present.  Cardiovascular: Normal rate, regular rhythm and normal heart sounds.  No murmur heard. Respiratory: Effort normal and breath sounds normal.  GI: Soft. He exhibits no distension and no mass. There is no abdominal tenderness.  Musculoskeletal:        General: No edema.  Lymphadenopathy:    He has no cervical adenopathy.  Skin: Skin is warm and dry.     Assessment/Plan Average risk screening colonoscopy.  Hildred Laser, MD 08/05/2019, 7:21 AM

## 2019-08-05 NOTE — Progress Notes (Addendum)
1623 08/05/2019. Late entry:  Wallet for patient given to [place]. Due to weekend and no response from patient or wife via telephone.

## 2019-08-05 NOTE — Progress Notes (Signed)
Call to [place] Wife at [phone] left message to call [place] [facility] regarding an important matter.  Attempted to call patient's cell number at [phone] with no answer. Patient had already left short stay with wife driving upon finding wallet.  Abbie Sons RN and Vinetta Bergamo observed no cash noted and Driver's license in wallet with multiple cards, locked in secure location until returned call from patient or wife.

## 2019-08-05 NOTE — Anesthesia Procedure Notes (Signed)
Date/Time: 08/05/2019 7:29 AM Performed by: Vista Deck, CRNA Pre-anesthesia Checklist: Patient identified, Emergency Drugs available, Suction available, Timeout performed and Patient being monitored Patient Re-evaluated:Patient Re-evaluated prior to induction Oxygen Delivery Method: Nasal Cannula

## 2019-08-05 NOTE — Op Note (Signed)
[facility] Patient Name: Billy Alvarez Procedure Date: 08/05/2019 6:53 AM MRN: [id number] Date of Birth: [birth year] Attending MD: Hildred Laser , MD CSN: [id number] Age: 54 Admit Type: Outpatient Procedure:                Colonoscopy Indications:              Screening for colorectal malignant neoplasm Providers:                Hildred Laser, MD, Otis Peak B. Sharon Seller, RN, Raphael Gibney, Technician Referring MD:             Tammi Sou, MD Medicines:                Propofol per Anesthesia Complications:            No immediate complications. Estimated Blood Loss:     Estimated blood loss: none. Procedure:                Pre-Anesthesia Assessment:                           - Prior to the procedure, a History and Physical                            was performed, and patient medications and                            allergies were reviewed. The patient's tolerance of                            previous anesthesia was also reviewed. The risks                            and benefits of the procedure and the sedation                            options and risks were discussed with the patient.                            All questions were answered, and informed consent                            was obtained. Prior Anticoagulants: The patient has                            taken no previous anticoagulant or antiplatelet                            agents. ASA Grade Assessment: II - A patient with                            mild systemic disease. After reviewing the risks  and benefits, the patient was deemed in                            satisfactory condition to undergo the procedure.                           After obtaining informed consent, the colonoscope                            was passed under direct vision. Throughout the                            procedure, the patient's blood pressure, pulse, and              oxygen saturations were monitored continuously. The                            [id number] scope was introduced through                            the anus and advanced to the the cecum, identified                            by appendiceal orifice and ileocecal valve. The                            colonoscopy was performed without difficulty. The                            patient tolerated the procedure well. The quality                            of the bowel preparation was good. The ileocecal                            valve, appendiceal orifice, and rectum were                            photographed. Scope In: 7:37:28 AM Scope Out: [id number] AM Scope Withdrawal Time: 0 hours 5 minutes 59 seconds  Total Procedure Duration: 0 hours 9 minutes 15 seconds  Findings:      The perianal and digital rectal examinations were normal.      The colon (entire examined portion) appeared normal.      External hemorrhoids were found during retroflexion. The hemorrhoids       were small. Impression:               - The entire examined colon is normal.                           - External hemorrhoids.                           - No specimens collected. Moderate Sedation:      Per Anesthesia Care Recommendation:           -  Patient has a contact number available for                            emergencies. The signs and symptoms of potential                            delayed complications were discussed with the                            patient. Return to normal activities tomorrow.                            Written discharge instructions were provided to the                            patient.                           - Resume previous diet today.                           - Continue present medications.                           - Repeat colonoscopy in 10 years for screening                            purposes. Procedure Code(s):        --- Professional ---                            [id number], Colonoscopy, flexible; diagnostic, including                            collection of specimen(s) by brushing or washing,                            when performed (separate procedure) Diagnosis Code(s):        --- Professional ---                           Z12.11, Encounter for screening for malignant                            neoplasm of colon                           K64.4, Residual hemorrhoidal skin tags CPT copyright 2019 American Medical Association. All rights reserved. The codes documented in this report are preliminary and upon coder review may  be revised to meet current compliance requirements. Hildred Laser, MD Hildred Laser, MD 08/05/2019 7:52:39 AM This report has been signed electronically. Number of Addenda: 0

## 2019-08-05 NOTE — Anesthesia Postprocedure Evaluation (Signed)
Anesthesia Post Note  Patient: Billy Alvarez  Procedure(s) Performed: COLONOSCOPY WITH PROPOFOL (N/A )  Patient location during evaluation: Phase II Anesthesia Type: General Level of consciousness: awake and alert and patient cooperative Pain management: satisfactory to patient Vital Signs Assessment: post-procedure vital signs reviewed and stable Respiratory status: spontaneous breathing Cardiovascular status: stable Postop Assessment: no apparent nausea or vomiting Anesthetic complications: no     Last Vitals:  Vitals:   08/05/19 0755 08/05/19 0816  BP: (!) 82/47 104/66  Pulse: (!) 57 60  Resp: 13 17  Temp: 36.6 C   SpO2: 97% 96%    Last Pain:  Vitals:   08/05/19 0816  PainSc: 0-No pain                 Althia Egolf

## 2019-08-05 NOTE — Transfer of Care (Signed)
Immediate Anesthesia Transfer of Care Note  Patient: Billy Alvarez  Procedure(s) Performed: COLONOSCOPY WITH PROPOFOL (N/A )  Patient Location: PACU  Anesthesia Type:General  Level of Consciousness: awake and patient cooperative  Airway & Oxygen Therapy: Patient Spontanous Breathing  Post-op Assessment: Report given to RN and Post -op Vital signs reviewed and stable  Post vital signs: Reviewed and stable  Last Vitals:  Vitals Value Taken Time  BP    Temp 97.5   Pulse    Resp    SpO2      Last Pain:  Vitals:   08/05/19 0732  PainSc: 0-No pain         Complications: No apparent anesthesia complications

## 2019-08-05 NOTE — Discharge Instructions (Signed)
Resume usual medications and diet as before. No driving for 24 hours. Next screening exam in 10 years.   Colonoscopy, Adult, Care After This sheet gives you information about how to care for yourself after your procedure. Your doctor may also give you more specific instructions. If you have problems or questions, call your doctor. What can I expect after the procedure? After the procedure, it is common to have:  A small amount of blood in your poop (stool) for 24 hours.  Some gas.  Mild cramping or bloating in your belly (abdomen). Follow these instructions at home: Eating and drinking   Drink enough fluid to keep your pee (urine) pale yellow.  Follow instructions from your doctor about what you cannot eat or drink.  Return to your normal diet as told by your doctor. Avoid heavy or fried foods that are hard to digest. Activity  Rest as told by your doctor.  Do not sit for a long time without moving. Get up to take short walks every 1-2 hours. This is important. Ask for help if you feel weak or unsteady.  Return to your normal activities as told by your doctor. Ask your doctor what activities are safe for you. To help cramping and bloating:   Try walking around.  Put heat on your belly as told by your doctor. Use the heat source that your doctor recommends, such as a moist heat pack or a heating pad. ? Put a towel between your skin and the heat source. ? Leave the heat on for 20-30 minutes. ? Remove the heat if your skin turns bright red. This is very important if you are unable to feel pain, heat, or cold. You may have a greater risk of getting burned. General instructions  For the first 24 hours after the procedure: ? Do not drive or use machinery. ? Do not sign important documents. ? Do not drink alcohol. ? Do your daily activities more slowly than normal. ? Eat foods that are soft and easy to digest.  Take over-the-counter or prescription medicines only as told by  your doctor.  Keep all follow-up visits as told by your doctor. This is important. Contact a doctor if:  You have blood in your poop 2-3 days after the procedure. Get help right away if:  You have more than a small amount of blood in your poop.  You see large clumps of tissue (blood clots) in your poop.  Your belly is swollen.  You feel like you may vomit (nauseous).  You vomit.  You have a fever.  You have belly pain that gets worse, and medicine does not help your pain. Summary  After the procedure, it is common to have a small amount of blood in your poop. You may also have mild cramping and bloating in your belly.  For the first 24 hours after the procedure, do not drive or use machinery, do not sign important documents, and do not drink alcohol.  Get help right away if you have a lot of blood in your poop, feel like you may vomit, have a fever, or have more belly pain. This information is not intended to replace advice given to you by your health care provider. Make sure you discuss any questions you have with your health care provider. Document Revised: 11/22/2018 Document Reviewed: 11/22/2018 Elsevier Patient Education  [place].

## 2019-08-05 NOTE — Anesthesia Preprocedure Evaluation (Signed)
Anesthesia Evaluation  Patient identified by MRN, date of birth, ID band Patient awake    Reviewed: Allergy & Precautions, H&P , NPO status , Patient's Chart, lab work & pertinent test results, reviewed documented beta blocker date and time   Airway Mallampati: II  TM Distance: >3 FB     Dental   Pulmonary neg pulmonary ROS,    breath sounds clear to auscultation       Cardiovascular negative cardio ROS   Rhythm:regular Rate:Normal     Neuro/Psych PSYCHIATRIC DISORDERS Anxiety negative neurological ROS     GI/Hepatic negative GI ROS, Neg liver ROS,   Endo/Other  negative endocrine ROS  Renal/GU negative Renal ROS     Musculoskeletal   Abdominal   Peds  Hematology   Anesthesia Other Findings   Reproductive/Obstetrics negative OB ROS                             Anesthesia Physical Anesthesia Plan  ASA: II  Anesthesia Plan: General   Post-op Pain Management:    Induction:   PONV Risk Score and Plan:   Airway Management Planned:   Additional Equipment:   Intra-op Plan:   Post-operative Plan:   Informed Consent: I have reviewed the patients History and Physical, chart, labs and discussed the procedure including the risks, benefits and alternatives for the proposed anesthesia with the patient or authorized representative who has indicated his/her understanding and acceptance.       Plan Discussed with: CRNA  Anesthesia Plan Comments:         Anesthesia Quick Evaluation

## 2019-08-05 NOTE — Progress Notes (Addendum)
Attempted call to wife Amoroso Malta [phone] and patient phone [phone]. Left message for wife to call short stay at [phone] if she received message to let us know when she was coming to pick up item left for her husband.  No message left on patient's voicemail due to patient had anesthesia today.  attemped call at 15:15 on 08/05/2019.

## 2019-08-20 ENCOUNTER — Other Ambulatory Visit: Payer: Self-pay | Admitting: Family Medicine

## 2019-09-05 ENCOUNTER — Ambulatory Visit: Payer: 59 | Attending: Internal Medicine

## 2019-09-05 ENCOUNTER — Other Ambulatory Visit: Payer: Self-pay

## 2019-09-05 ENCOUNTER — Other Ambulatory Visit: Payer: Self-pay | Admitting: Family Medicine

## 2019-09-05 DIAGNOSIS — Z20822 Contact with and (suspected) exposure to covid-19: Secondary | ICD-10-CM

## 2019-09-06 LAB — SARS-COV-2, NAA 2 DAY TAT

## 2019-09-06 LAB — NOVEL CORONAVIRUS, NAA: SARS-CoV-2, NAA: NOT DETECTED

## 2019-10-27 ENCOUNTER — Other Ambulatory Visit: Payer: Self-pay | Admitting: Family Medicine

## 2019-11-12 ENCOUNTER — Other Ambulatory Visit: Payer: Self-pay | Admitting: Family Medicine

## 2019-12-25 ENCOUNTER — Emergency Department (HOSPITAL_COMMUNITY): Admission: EM | Admit: 2019-12-25 | Discharge: 2019-12-25 | Discharge status: Against Medical Advice | Payer: 59

## 2019-12-25 NOTE — ED Notes (Signed)
Called for triage with no answer

## 2020-01-09 ENCOUNTER — Other Ambulatory Visit: Payer: Self-pay | Admitting: Family Medicine

## 2020-01-10 NOTE — Telephone Encounter (Signed)
Spoke with pt and scheduled OV for med refill

## 2020-01-24 ENCOUNTER — Other Ambulatory Visit: Payer: Self-pay

## 2020-01-24 ENCOUNTER — Encounter: Payer: Self-pay | Admitting: Family Medicine

## 2020-01-24 ENCOUNTER — Ambulatory Visit (INDEPENDENT_AMBULATORY_CARE_PROVIDER_SITE_OTHER): Payer: 59 | Admitting: Family Medicine

## 2020-01-24 VITALS — BP 115/75 | HR 54 | Temp 97.7°F | Resp 16 | Ht 74.0 in | Wt 228.0 lb

## 2020-01-24 DIAGNOSIS — D352 Benign neoplasm of pituitary gland: Secondary | ICD-10-CM

## 2020-01-24 DIAGNOSIS — F410 Panic disorder [episodic paroxysmal anxiety] without agoraphobia: Secondary | ICD-10-CM

## 2020-01-24 MED ORDER — CITALOPRAM HYDROBROMIDE 20 MG PO TABS: 20.0000 mg | ORAL_TABLET | Freq: Every day | ORAL | 3 refills | Status: DC

## 2020-01-24 NOTE — Progress Notes (Signed)
Office Note 01/24/2020  CC: No chief complaint on file.   HPI:  Billy Alvarez is a 54 y.o. White male who is here for panic disorder.  Has hx of pituitary microadenoma.  GAD/panic: He has clonazepam on hand at home but essentially never takes any. Review of pmpaware today shows NO CONTROLLED SUBSTANCE RXs. He feels like citalopram helping well.  He is wanting to eventually ween off this but NOT NOW.   Pit microad: had MRI 2020 with Dr. Quay Burow, specialist.  All was good, med is working. Has annual f/u.   Past Medical History:  Diagnosis Date  . Atypical chest pain 2007; 2016   2007 stress test neg per pt  . Colon cancer screening    cologuard neg 2017.  Referred pt for screening colonoscopy 06/2018.  Marland Kitchen Hyperlipidemia    med rx'd but pt eventually became noncompliant, preferred TLC approach.  Numbers stable 2018 and 2019.  Marland Kitchen Panic disorder   . 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 CARDIOVASCULAR STRESS TEST  2007   Neg per pt report  . COLONOSCOPY WITH PROPOFOL N/A 08/05/2019   Procedure: COLONOSCOPY WITH PROPOFOL;  Surgeon: Rogene Houston, MD;  Location: [facility];  Service: Endoscopy;  Laterality: N/A;  730    Family History  Problem Relation Age of Onset  . Heart disease Father   . Heart attack Paternal Grandmother   . Cancer Neg Hx   . Diabetes Neg Hx     Social History   Socioeconomic History  . Marital status: Married    Spouse name: Not on file  . Number of children: Not on file  . Years of education: Not on file  . Highest education level: Not on file  Occupational History  . Not on file  Tobacco Use  . Smoking status: Never Smoker  . Smokeless tobacco: Never Used  Substance and Sexual Activity  . Alcohol use: No  . Drug use: No  . Sexual activity: Not on file  Other Topics Concern  . Not on file  Social History Narrative   Married, 1 adult daughter, 1 adult son.   Educ: HS    Living in [place].   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   No T/A/Ds.   Social Determinants of Health   Financial Resource Strain:   . Difficulty of Paying Living Expenses: Not on file  Food Insecurity:   . Worried About Charity fundraiser in the Last Year: Not on file  . Ran Out of Food in the Last Year: Not on file  Transportation Needs:   . Lack of Transportation (Medical): Not on file  . Lack of Transportation (Non-Medical): Not on file  Physical Activity:   . Days of Exercise per Week: Not on file  . Minutes of Exercise per Session: Not on file  Stress:   . Feeling of Stress : Not on file  Social Connections:   . Frequency of Communication with Friends and Family: Not on file  . Frequency of Social Gatherings with Friends and Family: Not on file  . Attends Religious Services: Not on file  . Active Member of Clubs or Organizations: Not on file  . Attends Archivist Meetings: Not on file  . Marital Status: Not on file  Intimate Partner Violence:   . Fear of Current or Ex-Partner: Not on file  . Emotionally Abused: Not on file  . Physically  Abused: Not on file  . Sexually Abused: Not on file    Outpatient Medications Prior to Visit  Medication Sig Dispense Refill  . cabergoline (DOSTINEX) 0.5 MG tablet Take 0.5 mg by mouth 2 (two) times a week.    . citalopram (CELEXA) 20 MG tablet TAKE 1 TABLET BY MOUTH  DAILY 90 tablet 0  . clonazePAM (KLONOPIN) 0.5 MG tablet 1-2 tabs po bid prn anxiety (Patient taking differently: Take 0.5 mg by mouth 2 (two) times daily as needed for anxiety. ) 90 tablet 1   No facility-administered medications prior to visit.    No Known Allergies   PE; Vitals with BMI 08/05/2019 08/05/2019 08/05/2019  Height - - -  Weight - - -  BMI - - -  Systolic 841 82 324  Diastolic 66 47 73  Pulse 60 57 65   Gen: Alert, well appearing.  Patient is oriented to person, place, time, and situation. AFFECT: pleasant, lucid thought and  speech. No further exam today.  LABS: NONE TODAY  ASSESSMENT AND PLAN:   1) GAD w/ Panic disorder: doing great/stable long term on citalopram 20mg  qd. We discussed future weaning process if he decides to try this prior to a return visit to me. RF'd 90d supply of citalopram 20mg , 1 qd, RF x 3.  2) Hx of pituitary microadenoma: stable, most recent MRI and labs with specialist stable/improved. He'll continue annual specialist f/u.  An After Visit Summary was printed and given to the patient.  FOLLOW UP:  No follow-ups on file.  Signed:  Crissie Sickles, MD           01/24/2020

## 2020-04-25 ENCOUNTER — Other Ambulatory Visit: Payer: Self-pay

## 2020-04-25 ENCOUNTER — Ambulatory Visit
Admission: RE | Admit: 2020-04-25 | Discharge: 2020-04-25 | Disposition: A | Discharge status: Home / Self Care | Payer: 59 | Source: Ambulatory Visit | Attending: Physician Assistant | Admitting: Physician Assistant

## 2020-04-25 VITALS — BP 121/77 | HR 69 | Temp 99.3°F | Resp 18 | Ht 74.0 in | Wt 225.0 lb

## 2020-04-25 DIAGNOSIS — J0120 Acute ethmoidal sinusitis, unspecified: Secondary | ICD-10-CM | POA: Diagnosis not present

## 2020-04-25 MED ORDER — AMOXICILLIN-POT CLAVULANATE 875-125 MG PO TABS: 1.0000 | ORAL_TABLET | Freq: Two times a day (BID) | ORAL | 0 refills | Status: AC

## 2020-04-25 NOTE — ED Triage Notes (Signed)
Pt had sore throat, fever, cough and congestion x 2 weeks ago.  The sore throat has resolved and fever was better.  Yesterday pt's temp went up to 100 and he still has cough and congestion.  Has had 2 neg covid home test.

## 2020-04-25 NOTE — ED Provider Notes (Signed)
[facility]    CSN: [id number] Arrival date & time: 04/25/20  0856      History   Chief Complaint Chief Complaint  Patient presents with  . Fever    HPI Billy Alvarez is a 54 y.o. male.   The history is provided by the patient. No language interpreter was used.  Fever Temp source:  Subjective Severity:  Moderate Onset quality:  Gradual Duration:  2 weeks Timing:  Constant Progression:  Worsening Chronicity:  New Relieved by:  Nothing Worsened by:  Nothing Ineffective treatments:  None tried Associated symptoms: congestion, cough and headaches   Risk factors: no sick contacts   Pt had a negative home covid test.  Pt reports he has sinus pressure and congestion.    Past Medical History:  Diagnosis Date  . Atypical chest pain 2007; 2016   2007 stress test neg per pt  . Colon cancer screening    cologuard neg 2017.  Referred pt for screening colonoscopy 06/2018.  Marland Kitchen Hyperlipidemia    med rx'd but pt eventually became noncompliant, preferred TLC approach.  Numbers stable 2018 and 2019.  Marland Kitchen Panic disorder   . Pituitary tumor 2011   Microadenoma    Patient Active Problem List   Diagnosis Date Noted  . Special screening for malignant neoplasms, colon 05/02/2019  . Atypical chest pain 06/12/2015  . 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 CARDIOVASCULAR STRESS TEST  2007   Neg per pt report  . COLONOSCOPY WITH PROPOFOL N/A 08/05/2019   Procedure: COLONOSCOPY WITH PROPOFOL;  Surgeon: Rogene Houston, MD;  Location: [facility];  Service: Endoscopy;  Laterality: N/A;  730       Home Medications    Prior to Admission medications   Medication Sig Start Date End Date Taking? Authorizing Provider  amoxicillin-clavulanate (AUGMENTIN) 875-125 MG tablet Take 1 tablet by mouth every 12 (twelve) hours for 10 days. 04/25/20 05/05/20  Fransico Meadow, PA-C  cabergoline  (DOSTINEX) 0.5 MG tablet Take 0.5 mg by mouth 2 (two) times a week.    [provider]  citalopram (CELEXA) 20 MG tablet Take 1 tablet (20 mg total) by mouth daily. 01/24/20   McGowen, Adrian Blackwater, MD  clonazePAM (KLONOPIN) 0.5 MG tablet 1-2 tabs po bid prn anxiety Patient not taking: Reported on 01/24/2020 07/02/15   Tammi Sou, MD    Family History Family History  Problem Relation Age of Onset  . Heart disease Father   . Heart attack Paternal Grandmother   . Cancer Neg Hx   . Diabetes Neg Hx     Social History Social History   Tobacco Use  . Smoking status: Never Smoker  . Smokeless tobacco: Never Used  Substance Use Topics  . Alcohol use: No  . Drug use: No     Allergies   Patient has no known allergies.   Review of Systems Review of Systems  Constitutional: Positive for fever.  HENT: Positive for congestion.   Respiratory: Positive for cough.   Neurological: Positive for headaches.  All other systems reviewed and are negative.    Physical Exam Triage Vital Signs ED Triage Vitals  Enc Vitals Group     BP 04/25/20 0951 121/77     Pulse Rate 04/25/20 0951 69     Resp 04/25/20 0951 18     Temp 04/25/20 0951 99.3 F (37.4 C)     Temp  Source 04/25/20 0951 Oral     SpO2 04/25/20 0951 96 %     Weight 04/25/20 0950 225 lb (102.1 kg)     Height 04/25/20 0950 6\' 2"  (1.88 m)     Head Circumference --      Peak Flow --      Pain Score 04/25/20 0950 0     Pain Loc --      Pain Edu? --      Excl. in [place]? --    No data found.  Updated Vital Signs BP 121/77 (BP Location: Right Arm)   Pulse 69   Temp 99.3 F (37.4 C) (Oral)   Resp 18   Ht 6\' 2"  (1.88 m)   Wt 102.1 kg   SpO2 96%   BMI 28.89 kg/m   Visual Acuity Right Eye Distance:   Left Eye Distance:   Bilateral Distance:    Right Eye Near:   Left Eye Near:    Bilateral Near:     Physical Exam Vitals and nursing note reviewed.  Constitutional:      Appearance: He is well-developed and  well-nourished.  HENT:     Head: Normocephalic and atraumatic.     Comments: Tender frontal sinuses     Nose: Nose normal.  Eyes:     Conjunctiva/sclera: Conjunctivae normal.  Cardiovascular:     Rate and Rhythm: Normal rate and regular rhythm.     Heart sounds: No murmur heard.   Pulmonary:     Effort: Pulmonary effort is normal. No respiratory distress.     Breath sounds: Normal breath sounds.  Abdominal:     Palpations: Abdomen is soft.     Tenderness: There is no abdominal tenderness.  Musculoskeletal:        General: No edema.     Cervical back: Neck supple.  Skin:    General: Skin is warm and dry.  Neurological:     General: No focal deficit present.     Mental Status: He is alert.  Psychiatric:        Mood and Affect: Mood and affect and mood normal.      UC Treatments / Results  Labs (all labs ordered are listed, but only abnormal results are displayed) Labs Reviewed  COVID-19, FLU A+B NAA  Covid and influenza pending Pt's symptoms most consistent with sinusitis.  Pt given rx for augmentin.  Pt advised if covid or influenza no benefit to antibiotic.   EKG   Radiology No results found.  Procedures Procedures (including critical care time)  Medications Ordered in UC Medications - No data to display  Initial Impression / Assessment and Plan / UC Course  I have reviewed the triage vital signs and the nursing notes.  Pertinent labs & imaging results that were available during my care of the patient were reviewed by me and considered in my medical decision making (see chart for details).     Final Clinical Impressions(s) / UC Diagnoses   Final diagnoses:  Acute non-recurrent ethmoidal sinusitis     Discharge Instructions     Your covid test is pending.  Return if any problems.    ED Prescriptions    Medication Sig Dispense Auth. Provider   amoxicillin-clavulanate (AUGMENTIN) 875-125 MG tablet Take 1 tablet by mouth every 12 (twelve) hours for  10 days. 20 tablet Fransico Meadow, [place]     PDMP not reviewed this encounter.  An After Visit Summary was printed and given to the patient.  Fransico Meadow, [place] 04/25/20 1034

## 2020-04-25 NOTE — Discharge Instructions (Signed)
Your covid test is pending.  Return if any problems.

## 2020-04-27 LAB — COVID-19, FLU A+B NAA
Influenza A, NAA: NOT DETECTED
Influenza B, NAA: NOT DETECTED
SARS-CoV-2, NAA: NOT DETECTED

## 2020-09-17 ENCOUNTER — Encounter: Payer: Self-pay | Admitting: Primary Care

## 2020-09-17 ENCOUNTER — Telehealth: Payer: Self-pay

## 2020-09-17 ENCOUNTER — Other Ambulatory Visit: Payer: Self-pay

## 2020-09-17 ENCOUNTER — Telehealth (INDEPENDENT_AMBULATORY_CARE_PROVIDER_SITE_OTHER): Payer: 59 | Admitting: Primary Care

## 2020-09-17 VITALS — Ht 74.0 in | Wt 225.0 lb

## 2020-09-17 DIAGNOSIS — M545 Low back pain, unspecified: Secondary | ICD-10-CM

## 2020-09-17 MED ORDER — DICLOFENAC SODIUM 75 MG PO TBEC: 75.0000 mg | DELAYED_RELEASE_TABLET | Freq: Two times a day (BID) | ORAL | 0 refills | Status: DC | PRN

## 2020-09-17 MED ORDER — CYCLOBENZAPRINE HCL 10 MG PO TABS: 10.0000 mg | ORAL_TABLET | Freq: Three times a day (TID) | ORAL | 0 refills | Status: DC | PRN

## 2020-09-17 NOTE — Telephone Encounter (Signed)
Patient calling to request refills on meds for his back.  He said Dr. Anitra Lauth treated him a couple of years ago.  I told patient Dr. Anitra Lauth will require an appt. He declined appt.  He said that he cant get out of bed and cannot move.  He requested that I send a message back to see if Dr. Anitra Lauth would at least refill the following meds  diclofenac (VOLTAREN) 75 MG EC tablet  cyclobenzaprine (FLEXERIL) 10 MG tablet   Please advise.  Patient can be reached at [phone].

## 2020-09-17 NOTE — Assessment & Plan Note (Signed)
Acute on chronic flare, no alarm signs. Unable to do much ROM during visit.  Agree to provide diclofenac 75 mg BID PRN and cyclobenzaprine 10 mg TID PRN to help with flare as this has historically worked well.  We did discuss the need to get out of bed and begin stretching and walking when possible, he agrees.   He will follow up with his PCP for ongoing symptoms despite treatment if needed.

## 2020-09-17 NOTE — Telephone Encounter (Signed)
Scheduled pt with Dr. Carlis Abbott today and advised pt to contact office to schedule RCI with PCP

## 2020-09-17 NOTE — Progress Notes (Signed)
Patient ID: Billy Alvarez, male    DOB: 29-Oct-1965, 55 y.o.   MRN: [id number]  Virtual visit completed through [facility], a video enabled telemedicine application. Due to national recommendations of social distancing due to COVID-19, a virtual visit is felt to be most appropriate for this patient at this time. Reviewed limitations, risks, security and privacy concerns of performing a virtual visit and the availability of in person appointments. I also reviewed that there may be a patient responsible charge related to this service. The patient agreed to proceed.   Patient location: home Provider location: [place] [facility], office Persons participating in this virtual visit: patient, provider   If any vitals were documented, they were collected by patient at home unless specified below.    Ht 6\' 2"  (1.88 m)   Wt 225 lb (102.1 kg)   BMI 28.89 kg/m    CC: Back Pain Subjective:   HPI: Billy Alvarez is a 55 y.o. male patient of Dr. Anitra Lauth presenting on 09/17/2020 with back pain.  History of chronic back pain for years, has a "buling disc" for which he's had to deal with for years. Is not a candidate for surgery at this time.   His pain is located to the right lower back which began two days when bending forward. Sudden onset, felt that his back "gave out", started to experience  Spasms with inability to move.  He has been mostly bed bound as he cannot move due to pain. He's experienced intermittent flares for the same symptoms previously, has been prescribed diclofenac and a muscle relaxer during flares with improvement and eventual resolve. Flares are infrequent occurring once every 1-2 years.   He denies radiation of pain, numbness/tingling, loss of bowel/bladder control.         Relevant past medical, surgical, family and social history reviewed and updated as indicated. Interim medical history since our last visit reviewed. Allergies and medications reviewed and  updated. Outpatient Medications Prior to Visit  Medication Sig Dispense Refill  . cabergoline (DOSTINEX) 0.5 MG tablet Take 0.5 mg by mouth 2 (two) times a week.    . citalopram (CELEXA) 20 MG tablet Take 1 tablet (20 mg total) by mouth daily. 90 tablet 3  . clonazePAM (KLONOPIN) 0.5 MG tablet 1-2 tabs po bid prn anxiety 90 tablet 1   No facility-administered medications prior to visit.     Per HPI unless specifically indicated in ROS section below Review of Systems Objective:  Ht 6\' 2"  (1.88 m)   Wt 225 lb (102.1 kg)   BMI 28.89 kg/m   Wt Readings from Last 3 Encounters:  09/17/20 225 lb (102.1 kg)  04/25/20 225 lb (102.1 kg)  01/24/20 228 lb (103.4 kg)       Physical exam: Gen: alert, NAD, not ill appearing, appears uncomfortable. Laying in bed during visit.  Pulm: speaks in complete sentences without increased work of breathing Psych: normal mood, normal thought content      Results for orders placed or performed during the hospital encounter of 04/25/20  Covid-19, Flu A+B ([facility])   Specimen: Nasopharyngeal   Naso  Result Value Ref Range   SARS-CoV-2, NAA Not Detected Not Detected   Influenza A, NAA Not Detected Not Detected   Influenza B, NAA Not Detected Not Detected   Assessment & Plan:   Problem List Items Addressed This Visit      Other   Acute right-sided low back pain without sciatica - Primary  Acute on chronic flare, no alarm signs. Unable to do much ROM during visit.  Agree to provide diclofenac 75 mg BID PRN and cyclobenzaprine 10 mg TID PRN to help with flare as this has historically worked well.  We did discuss the need to get out of bed and begin stretching and walking when possible, he agrees.   He will follow up with his PCP for ongoing symptoms despite treatment if needed.      Relevant Medications   diclofenac (VOLTAREN) 75 MG EC tablet   cyclobenzaprine (FLEXERIL) 10 MG tablet       Meds ordered this encounter  Medications  .  diclofenac (VOLTAREN) 75 MG EC tablet    Sig: Take 1 tablet (75 mg total) by mouth 2 (two) times daily as needed. For pain.    Dispense:  30 tablet    Refill:  0    Order Specific Question:   Supervising Provider    Answer:   BEDSOLE, AMY E [[id number]]  . cyclobenzaprine (FLEXERIL) 10 MG tablet    Sig: Take 1 tablet (10 mg total) by mouth 3 (three) times daily as needed for muscle spasms.    Dispense:  30 tablet    Refill:  0    Order Specific Question:   Supervising Provider    Answer:   BEDSOLE, AMY E [[id number]]   No orders of the defined types were placed in this encounter.   I discussed the assessment and treatment plan with the patient. The patient was provided an opportunity to ask questions and all were answered. The patient agreed with the plan and demonstrated an understanding of the instructions. The patient was advised to call back or seek an in-person evaluation if the symptoms worsen or if the condition fails to improve as anticipated.  Follow up plan:  You may take the diclofenac 75 mg twice daily as needed for pain. Do not take any other ibuprofen/Aleve/Motrin.  You may take the cyclobenzaprine 10 mg up to three times daily for muscle spasm. This may cause drowsiness.   Be sure to walk and stretch when able.   It was a pleasure meeting you! Allie Bossier, NP-C   Pleas Koch, NP

## 2020-09-17 NOTE — Patient Instructions (Signed)
You may take the diclofenac 75 mg twice daily as needed for pain. Do not take any other ibuprofen/Aleve/Motrin.  You may take the cyclobenzaprine 10 mg up to three times daily for muscle spasm. This may cause drowsiness.   Be sure to walk and stretch when able.   It was a pleasure meeting you! Allie Bossier, NP-C

## 2020-11-21 ENCOUNTER — Encounter

## 2020-11-22 ENCOUNTER — Ambulatory Visit
Admit: 2020-11-22 | Discharge: 2020-11-22 | Payer: PRIVATE HEALTH INSURANCE | Attending: Adult Reconstructive Orthopaedic Surgery

## 2020-11-22 ENCOUNTER — Inpatient Hospital Stay: Admit: 2020-11-22 | Payer: PRIVATE HEALTH INSURANCE

## 2020-11-22 ENCOUNTER — Ambulatory Visit: Attending: Adult Reconstructive Orthopaedic Surgery

## 2020-11-22 DIAGNOSIS — M659 Synovitis and tenosynovitis, unspecified: Secondary | ICD-10-CM

## 2020-11-22 DIAGNOSIS — M19042 Primary osteoarthritis, left hand: Secondary | ICD-10-CM

## 2020-11-22 MED ORDER — BETAMETHASONE ACET & SOD PHOS 6 MG/ML SUSP FOR INJECTION
6 mg/mL | Freq: Once | INTRAMUSCULAR | Status: AC
Start: 2020-11-22 — End: 2020-11-22
  Administered 2020-11-22: 16:00:00

## 2020-11-22 NOTE — Progress Notes (Signed)
 Identified pt with two pt identifiers (name and DOB). Reviewed chart in preparation for visit and have obtained necessary documentation.  Aaron Cantrell is a 55 y.o. male  Chief Complaint   Patient presents with   . Wrist Pain     LT Wrist      Visit Vitals  BP (!) 148/108 (BP 1 Location: Right arm, BP Patient Position: Sitting, BP Cuff Size: Large adult)   Pulse 69   Temp 97.4 F (36.3 C) (Tympanic)   Ht 5' 7 (1.702 m)   Wt 168 lb (76.2 kg)   SpO2 100%   BMI 26.31 kg/m     1. Have you been to the ER, urgent care clinic since your last visit?  Hospitalized since your last visit?No    2. Have you seen or consulted any other health care providers outside of the [facility] since your last visit?  Include any pap smears or colon screening. No

## 2020-11-22 NOTE — Progress Notes (Signed)
11/22/2020      CC: Left wrist pain    HPI:      This is a 55 y.o. year old male who has a one month history of left wrist pain.  Onset was after gardening.  This is associated with pain while lifting.  Pain is on the radial aspect of the wrist, made worse with activity.  So far, treatments have consisted of: nothing.  Patient is right hand dominant.      Assessment: Left de Quervain's tenosynovitis  Plan:    This patient and I had a long discussion regarding treatment options, this is an inflammatory and overuse type of situation.  Treatment options going forward would include, as initial treatments, bracing, anti-inflammatory treatment either systemically or locally, injections, physical therapy exercises and activity modification.  If all of this fails, there is a surgical option, however it is reasonable to hold off as long as possible.  Reasonable expectations for recovery were given, patient has agreed to injection, stretching, bracing.    Follow-up will be one week.      PMH:    History reviewed. No pertinent past medical history.    PSxHx:  Past Surgical History:   Procedure Laterality Date   ??? PR ABDOMEN SURGERY PROC UNLISTED      hernia repair       Meds:    Current Outpatient Medications:   ???  fluticasone propionate (FLONASE) 50 mcg/actuation nasal spray, 2 Sprays by Both Nostrils route daily. (Patient not taking: Reported on 11/22/2020), Disp: 1 Bottle, Rfl: 0    Current Facility-Administered Medications:   ???  betamethasone (CELESTONE) injection 6 mg, 6 mg, Other, ONCE, Carma Dwiggins M, DO    All:  No Known Allergies    Social Hx:  Social History     Socioeconomic History   ??? Marital status: MARRIED   Tobacco Use   ??? Smoking status: Never Smoker   ??? Smokeless tobacco: Never Used   Substance and Sexual Activity   ??? Alcohol use: Never   ??? Drug use: Never   ??? Sexual activity: Not Currently       Family Hx:  No family history on file.      Review of Systems:       General: Denies headache, lethargy, fever,  weight loss  Ears/Nose/Throat: Denies ear discharge, drainage, nosebleeds, hoarse voice, dental problems  Cardiovascular: Denies chest pain, shortness of breath  Lungs: Denies chest pain, breathing problems, wheezing, pneumonia  Stomach: Denies stomach pain, heartburn, constipation, irritable bowel  Skin: Denies rash, sores, open wounds  Musculoskeletal: left wrist pain  Genitourinary: Denies dysuria, hematuria, polyuria  Gastrointestinal: Denies constipation, obstipation, diarrhea  Neurological: Denies changes in sight, smell, hearing, taste, seizures. Denies loss of consciousness.  Psychiatric: Denies depression, sleep pattern changes, anxiety, change in personality  Endocrine: Denies mood swings, heat or cold intolerance  Hematologic/Lymphatic: Denies anemia, purpura, petechia  Allergic/Immunologic: Denies swelling of throat, pain or swelling at lymph nodes      Physical Examination:    Visit Vitals  BP (!) 148/108 (BP 1 Location: Right arm, BP Patient Position: Sitting, BP Cuff Size: Large adult)   Pulse 69   Temp 97.4 ??F (36.3 ??C) (Tympanic)   Ht 5\' 7"  (1.702 m)   Wt 168 lb (76.2 kg)   SpO2 100%   BMI 26.31 kg/m??        General: AOX3, no apparent distress  Psychiatric: mood and affect appropriate  Lungs: breathing is symmetric and unlabored  bilaterally  Heart: regular rate and rhythm  Abdomen: no guarding  Head: normocephalic, atraumatic  Skin: No significant abnormalities, good turgor  Sensation intact to light touch: C5-T1 dermatomes  Muscular exam: 5/5 strength in all major muscle groups unless noted in specialty exam.    Extremities      Right upper extremity: No gross deformity.  No restriction to range of motion of the shoulder, elbow, wrist.  Neck range of motion is full and pain free.  Muscle bulk is appropriate without wasting.  Sensation is intact to light touch in the C5-T1 dermatomes.  Capillary refill is less than 2 seconds in the fingers.  Strength testing is 5/5 at the major muscle groups of the  shoulder, elbow, and wrist.      Left upper extremity:  No gross deformity.  Significant tenderness palpation is noted at the first dorsal compartment.  Positive Finkelstein's maneuver.  Negative Durkin's, negative Tinel's at the carpal tunnel.  No restriction to range of motion of the shoulder, elbow, wrist.  Neck range of motion is full and pain free.  Muscle bulk is appropriate without wasting.  Sensation is intact to light touch in the C5-T1 dermatomes.  Capillary refill is less than 2 seconds in the fingers.  Strength testing is 5/5 at the major muscle groups of the shoulder, elbow, and wrist.            Diagnostics:    Pertinent Diagnostics: Xrays of the left wrist and thumb indicate no fractures, osseus lesions, abnormalities, cartilage space is well maintained.  Overall alignment is within normal limits, no effusion or other soft tissue abnormality.        PROCEDURE NOTE: Left dequervain's injection of Celestone    Consent was obtained from the patient. The correct site was identified after confirmation with the patient.  Following identification and confirmation of the correct site with the patient, the left 1st dorsal compartment.  A local anesthetic of 1% lidocaine without epinephrine was then administered to the local tissues.  Following, an injection of a mixture of  6 mg Celestone and 1% lidocaine without epinephrine was administered to the tendon and sheath. The needle was then withdrawn and the injection site dressed with a sterile bandage at the conclusion.  The procedure was well tolerated by the patient.  No immediate adverse reactions were noted.  Post injection instructions were given.    Mr. Sagar has a reminder for a "due or due soon" health maintenance. I have asked that he contact his primary care provider for follow-up on this health maintenance.

## 2020-11-26 ENCOUNTER — Other Ambulatory Visit: Payer: Self-pay | Admitting: Family Medicine

## 2020-12-03 ENCOUNTER — Telehealth
Admit: 2020-12-03 | Discharge: 2020-12-03 | Payer: PRIVATE HEALTH INSURANCE | Attending: Adult Reconstructive Orthopaedic Surgery

## 2020-12-03 ENCOUNTER — Telehealth: Attending: Adult Reconstructive Orthopaedic Surgery

## 2020-12-03 DIAGNOSIS — M659 Synovitis and tenosynovitis, unspecified: Secondary | ICD-10-CM

## 2020-12-03 NOTE — Progress Notes (Signed)
12/03/2020      CC: left wrist pain    HPI:      This is a 55 y.o. year old male who presents for follow up of their left deQuervain's injection.   The patient states that they have had very good relief of their pain.   The time since injection has been one week.      PMH:  No past medical history on file.    PSxHx:  Past Surgical History:   Procedure Laterality Date    PR ABDOMEN SURGERY PROC UNLISTED      hernia repair       Meds:    Current Outpatient Medications:     fluticasone propionate (FLONASE) 50 mcg/actuation nasal spray, 2 Sprays by Both Nostrils route daily. (Patient not taking: Reported on 11/22/2020), Disp: 1 Bottle, Rfl: 0    All:  No Known Allergies    Social Hx:  Social History     Socioeconomic History    Marital status: MARRIED   Tobacco Use    Smoking status: Never    Smokeless tobacco: Never   Substance and Sexual Activity    Alcohol use: Never    Drug use: Never    Sexual activity: Not Currently       Family Hx:  No family history on file.      Review of Systems:       General: Denies headache, lethargy, fever, weight loss  Ears/Nose/Throat: Denies ear discharge, drainage, nosebleeds, hoarse voice, dental problems  Cardiovascular: Denies chest pain, shortness of breath  Lungs: Denies chest pain, breathing problems, wheezing, pneumonia  Stomach: Denies stomach pain, heartburn, constipation, irritable bowel  Skin: Denies rash, sores, open wounds  Musculoskeletal: resolved left wrist pain  Genitourinary: Denies dysuria, hematuria, polyuria  Gastrointestinal: Denies constipation, obstipation, diarrhea  Neurological: Denies changes in sight, smell, hearing, taste, seizures. Denies loss of consciousness.  Psychiatric: Denies depression, sleep pattern changes, anxiety, change in personality  Endocrine: Denies mood swings, heat or cold intolerance  Hematologic/Lymphatic: Denies anemia, purpura, petechia  Allergic/Immunologic: Denies swelling of throat, pain or swelling at lymph nodes      Physical  Examination:    There were no vitals taken for this visit.     General: AOX3, no apparent distress  Psychiatric: mood and affect appropriate        Diagnostics:    Pertinent Diagnostics: none    Assessment: Status post left 1st dorsal compartment injection  Plan:    This patient and I discussed the normal course for injections, we discussed that pain relief will likely be temporary to some degree, but I cannot predict the longevity of relief.   We also discussed the limitations of injections, and that I cannot inject the same area any more often than every three months.   We will proceed with stretching. Patient was in [place] at the time of consultation.    I was in the office while conducting this encounter.    Consent:  He and/or his healthcare decision maker is aware that this patient-initiated Telehealth encounter is a billable service, with coverage as determined by his insurance carrier. He is aware that he may receive a bill and has provided verbal consent to proceed: Yes    This virtual visit was conducted telephone encounter only. -  I affirm this is a Patient Initiated Episode with an Established Patient who has not had a related appointment within my department in the past 7 days or scheduled within the  next 24 hours.  Note: this encounter is not billable if this call serves to triage the patient into an appointment for the relevant concern.      Total Time: minutes: 5-10 minutes.        Mr. Dirocco has a reminder for a "due or due soon" health maintenance. I have asked that he contact his primary care provider for follow-up on this health maintenance.

## 2021-10-18 ENCOUNTER — Ambulatory Visit
Admission: EM | Admit: 2021-10-18 | Discharge: 2021-10-18 | Disposition: A | Discharge status: Home / Self Care | Payer: 59

## 2021-10-18 DIAGNOSIS — H66002 Acute suppurative otitis media without spontaneous rupture of ear drum, left ear: Secondary | ICD-10-CM

## 2021-10-18 MED ORDER — AMOXICILLIN 875 MG PO TABS: 875.0000 mg | ORAL_TABLET | Freq: Two times a day (BID) | ORAL | 0 refills | Status: AC

## 2021-10-18 NOTE — ED Triage Notes (Signed)
Pt reports cough, nasal congestion x 3 days; left ear pain x 1 day. Tylenol and ibuprofen gives relief for few hours.

## 2021-10-18 NOTE — ED Provider Notes (Signed)
[facility]    CSN: [id number] Arrival date & time: 10/18/21  1319      History   Chief Complaint Chief Complaint  Patient presents with   Cough   Otalgia    HPI Billy Alvarez is a 56 y.o. male.   Patient presents for cough and nasal congestion that have been going on the past few days, he thinks he has a virus caught from one of his grandchildren.  Yesterday, he developed sudden left ear pain that he rates as a 6/10.  Reports he is having some muffled hearing from that side, however denies any ear drainage.  Denies use of Q-tips.  Reports history of ear infections as a child and from what he remembers, this is what it felt like.  When he moves his head forward, he hears fluid behind his left ear.  He has been taking over-the-counter medication for his upper respiratory symptoms that help temporarily.   He endorses cough, nasal congestion, postnasal drainage.  Denies chest pain, shortness of breath, abdominal pain, nausea/vomiting, rash, and fatigue.    Past Medical History:  Diagnosis Date   Atypical chest pain 2007; 2016   2007 stress test neg per pt   Colon cancer screening    cologuard neg 2017.  Referred pt for screening colonoscopy 06/2018.   Hyperlipidemia    med rx'd but pt eventually became noncompliant, preferred [facility] approach.  Numbers stable 2018 and 2019.   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 Authorizing Provider  acetaminophen (TYLENOL) 500 MG tablet Take 500 mg by mouth every 6 (six) hours as needed.   Yes [provider]  amoxicillin (AMOXIL) 875 MG tablet Take 1 tablet (875 mg total) by mouth 2 (two) times daily for 5 days. 10/18/21 10/23/21 Yes Noemi Chapel A, NP  ibuprofen (ADVIL) 200 MG tablet Take 200 mg by mouth every 6 (six) hours as needed.   Yes [provider]  cabergoline (DOSTINEX) 0.5 MG tablet Take 0.5 mg by mouth 2 (two) times a week.    [provider]  citalopram (CELEXA) 20 MG tablet Take 1 tablet (20 mg total) by mouth daily. 01/24/20   McGowen, Adrian Blackwater, MD  clonazePAM (KLONOPIN) 0.5 MG tablet 1-2 tabs po bid prn anxiety 07/02/15   McGowen, Adrian Blackwater, MD  cyclobenzaprine (FLEXERIL) 10 MG tablet Take 1 tablet (10 mg total) by mouth 3 (three) times daily as needed for muscle spasms. 09/17/20   Pleas Koch, NP  diclofenac (VOLTAREN) 75 MG EC tablet Take 1 tablet (75 mg total) by mouth 2 (two) times daily as needed. For pain. 09/17/20   Pleas Koch, NP    Family History Family History  Problem Relation Age of Onset  Heart disease Father    Heart attack Paternal Grandmother    Cancer Neg Hx    Diabetes Neg Hx     Social History Social History   Tobacco Use   Smoking status: Never   Smokeless tobacco: Never  Substance Use Topics   Alcohol use: No   Drug use: No     Allergies   Patient has no known allergies.   Review of Systems Review of Systems Per HPI  Physical Exam Triage Vital Signs ED Triage Vitals  Enc Vitals Group     BP 10/18/21 1329 132/83     Pulse Rate 10/18/21 1329 92     Resp 10/18/21 1331 16     Temp 10/18/21 1329 97.7 F (36.5 C)     Temp Source 10/18/21 1329 Oral     SpO2 10/18/21 1329 98 %     Weight --      Height --      Head  Circumference --      Peak Flow --      Pain Score 10/18/21 1330 6     Pain Loc --      Pain Edu? --      Excl. in [place]? --    No data found.  Updated Vital Signs BP 132/83 (BP Location: Right Arm)   Pulse 92   Temp 97.7 F (36.5 C) (Oral)   Resp 16   SpO2 98%   Visual Acuity Right Eye Distance:   Left Eye Distance:   Bilateral Distance:    Right Eye Near:   Left Eye Near:    Bilateral Near:     Physical Exam Vitals and nursing note reviewed.  Constitutional:      General: He is not in acute distress.    Appearance: Normal appearance. He is not toxic-appearing.  HENT:     Head: Normocephalic.     Right Ear: Tympanic membrane, ear canal and external ear normal.     Left Ear: Tympanic membrane is erythematous and bulging. Tympanic membrane is not scarred or perforated.     Nose: Congestion present. No rhinorrhea.     Mouth/Throat:     Mouth: Mucous membranes are moist.     Pharynx: Oropharynx is clear. Posterior oropharyngeal erythema present.  Eyes:     General: No scleral icterus.    Extraocular Movements: Extraocular movements intact.  Cardiovascular:     Rate and Rhythm: Normal rate and regular rhythm.  Pulmonary:     Effort: Pulmonary effort is normal. No respiratory distress.     Breath sounds: Normal breath sounds. No wheezing, rhonchi or rales.  Musculoskeletal:     Cervical back: Normal range of motion and neck supple.  Lymphadenopathy:     Cervical: No cervical adenopathy.  Skin:    General: Skin is warm and dry.     Capillary Refill: Capillary refill takes less than 2 seconds.     Coloration: Skin is not jaundiced or pale.     Findings: No erythema.  Neurological:     Mental Status: He is alert and oriented to person, place, and time.  Psychiatric:        Behavior: Behavior is cooperative.      UC Treatments / Results  Labs (all labs ordered are listed, but only abnormal results are displayed) Labs Reviewed - No data to  display  EKG   Radiology No results found.  Procedures Procedures (including critical care time)  Medications Ordered in UC Medications -  No data to display  Initial Impression / Assessment and Plan / UC Course  I have reviewed the triage vital signs and the nursing notes.  Pertinent labs & imaging results that were available during my care of the patient were reviewed by me and considered in my medical decision making (see chart for details).    Treat acute otitis media with Amoxicillin 875 mg twice daily for 5 days.  Discussed ER precautions.  Discussed supportive care for other viral symptoms including Mucinex, nasal saline, hydration.  Encouraged him to seek care if symptoms persist or worsen despite treatment. Final Clinical Impressions(s) / UC Diagnoses   Final diagnoses:  Non-recurrent acute suppurative otitis media of left ear without spontaneous rupture of tympanic membrane     Discharge Instructions      - Your left ear drum does appear to have infection behind it - Please start the Amoxicillin 875 mg and take it twice daily for 5 days - You can also use plain Mucinex, nasal saline rinses, and push fluids to help with your symptoms  - Please seek care if your symptoms persist or worsen despite treatment    ED Prescriptions     Medication Sig Dispense Auth. Provider   amoxicillin (AMOXIL) 875 MG tablet Take 1 tablet (875 mg total) by mouth 2 (two) times daily for 5 days. 10 tablet Eulogio Bear, NP      PDMP not reviewed this encounter.   Eulogio Bear, NP 10/18/21 1418

## 2021-10-18 NOTE — Discharge Instructions (Addendum)
-   Your left ear drum does appear to have infection behind it - Please start the Amoxicillin 875 mg and take it twice daily for 5 days - You can also use plain Mucinex, nasal saline rinses, and push fluids to help with your symptoms  - Please seek care if your symptoms persist or worsen despite treatment

## 2021-10-23 ENCOUNTER — Telehealth: Payer: Self-pay | Admitting: Emergency Medicine

## 2021-10-23 NOTE — Telephone Encounter (Signed)
Pt called and inquired if steroid could be prescribed as abx helped but ear fullness and ringing persists. Consulted provider and recommended pt try OTC flonase. Pt verbalized understanding.

## 2021-10-25 ENCOUNTER — Telehealth (INDEPENDENT_AMBULATORY_CARE_PROVIDER_SITE_OTHER): Payer: 59 | Admitting: Nurse Practitioner

## 2021-10-25 ENCOUNTER — Encounter: Payer: Self-pay | Admitting: Nurse Practitioner

## 2021-10-25 DIAGNOSIS — H6692 Otitis media, unspecified, left ear: Secondary | ICD-10-CM

## 2021-10-25 MED ORDER — AMOXICILLIN-POT CLAVULANATE 875-125 MG PO TABS: 1.0000 | ORAL_TABLET | Freq: Two times a day (BID) | ORAL | 0 refills | Status: DC

## 2021-10-25 MED ORDER — PREDNISONE 20 MG PO TABS: 40.0000 mg | ORAL_TABLET | Freq: Every day | ORAL | 0 refills | Status: DC

## 2021-10-25 NOTE — Progress Notes (Signed)
   Established Patient Office Visit  An audio/visual tele-health visit was completed today for this patient. I connected with  Donzetta Kohut on 10/25/21 utilizing audio/visual technology and verified that I am speaking with the correct person using two identifiers. The patient was located at their home, and I was located at the office of [place] at [facility] during the encounter. I discussed the limitations of evaluation and management by telemedicine. The patient expressed understanding and agreed to proceed.     Subjective   Patient ID: Billy Alvarez, male    DOB: 12-22-1965  Age: 56 y.o. MRN: [id number]  Chief Complaint  Patient presents with   Otitis Media    Patient arrives a virtual visit for the above.  He reports that symptoms initially started 2 weeks ago and he was basely having upper respiratory infection such as congestion.  Eventually most of those symptoms subsided but he started to experience left ear pain.  He reports having been evaluated in urgent care and was treated with amoxicillin.  Pain persisted and he is calling today for further evaluation and treatment.  He tells me his wife had a recent infection with similar symptoms as well and she was treated with prednisone which resulted in symptoms resolving.  He is getting ready to go out of town and travel to [place].  Per chart review patient had metabolic panel collected with [facility] approximately 1 year ago.  Based on this creatinine clearance is approximately 103.       Objective:     There were no vitals taken for this visit. BP Readings from Last 3 Encounters:  10/18/21 132/83  04/25/20 121/77  01/24/20 115/75   Wt Readings from Last 3 Encounters:  09/17/20 225 lb (102.1 kg)  04/25/20 225 lb (102.1 kg)  01/24/20 228 lb (103.4 kg)      Physical Exam Comprehensive physical exam not completed today as office visit was conducted remotely.  Patient appeared well over the phone.  Patient was  alert and oriented, and appeared to have appropriate judgment.   No results found for any visits on 10/25/21.    The ASCVD Risk score (Arnett DK, et al., 2019) failed to calculate for the following reasons:   Cannot find a previous HDL lab   Cannot find a previous total cholesterol lab    Assessment & Plan:  Left otitis media.  Symptoms persist, unfortunately unable to perform physical exam however we will treat with Augmentin and short course of prednisone. Patient was educated on side effects of prednisone including insomnia, increased risk of GI bleed, increased appetite, and irritability.  He was told to take medication in the morning with food and to avoid NSAID use while taking the medication.  The patient reports their understanding.  Patient was told to notify his primary care provider if symptoms persist despite taking these medications, and consider referral to ENT if this were to occur.  He reports his understanding. Problem List Items Addressed This Visit   None Visit Diagnoses     Left otitis media, unspecified otitis media type    -  Primary   Relevant Medications   predniSONE (DELTASONE) 20 MG tablet   amoxicillin-clavulanate (AUGMENTIN) 875-125 MG tablet       Return if symptoms worsen or fail to improve.    Ailene Ards, NP

## 2021-11-07 ENCOUNTER — Encounter: Payer: Self-pay | Admitting: Family Medicine

## 2021-11-07 ENCOUNTER — Telehealth (INDEPENDENT_AMBULATORY_CARE_PROVIDER_SITE_OTHER): Payer: 59 | Admitting: Family Medicine

## 2021-11-07 ENCOUNTER — Telehealth: Payer: Self-pay | Admitting: Family Medicine

## 2021-11-07 ENCOUNTER — Other Ambulatory Visit: Payer: Self-pay

## 2021-11-07 VITALS — BP 132/68 | HR 57 | Wt 233.0 lb

## 2021-11-07 DIAGNOSIS — M545 Low back pain, unspecified: Secondary | ICD-10-CM

## 2021-11-07 DIAGNOSIS — M5136 Other intervertebral disc degeneration, lumbar region: Secondary | ICD-10-CM | POA: Diagnosis not present

## 2021-11-07 MED ORDER — CYCLOBENZAPRINE HCL 10 MG PO TABS: 10.0000 mg | ORAL_TABLET | Freq: Three times a day (TID) | ORAL | 1 refills | Status: DC | PRN

## 2021-11-07 MED ORDER — DICLOFENAC SODIUM 75 MG PO TBEC: 75.0000 mg | DELAYED_RELEASE_TABLET | Freq: Two times a day (BID) | ORAL | 1 refills | Status: DC | PRN

## 2021-11-07 NOTE — Progress Notes (Signed)
Virtual Visit via Video Note  I connected with Billy Alvarez on 11/07/21 at  4:20 PM EDT by a video enabled telemedicine application and verified that I am speaking with the correct person using two identifiers.  Location patient: [place] Location provider:work or home office Persons participating in the virtual visit: patient, provider  I discussed the limitations and requested verbal permission for telemedicine visit. The patient expressed understanding and agreed to proceed.   HPI: 56 y/o male being seen today for back pain. This morning he coughed forcefully and immediately felt severe pain in his low back.  It immobilizes him to the point that even small movements to try to get up and out of bed causes severe spasm.  No radiation into the hip or down the leg. He does have a history of recurrent low back pain. Has never had back injection or surgical procedure.  He states he does have a past diagnosis of lumbar degenerative disc disease.  In instances like this in the past he has taken diclofenac and Flexeril with significant success. He has not had any of these medications in quite a while.  ROS: See pertinent positives and negatives per HPI.  Past Medical History:  Diagnosis Date   Atypical chest pain 2007; 2016   2007 stress test neg per pt   Colon cancer screening    cologuard neg 2017.  Referred pt for screening colonoscopy 06/2018.   Hyperlipidemia    med rx'd but pt eventually became noncompliant, preferred TLC approach.  Numbers stable 2018 and 2019.   Panic disorder    Pituitary tumor 2011   Microadenoma    Past Surgical History:  Procedure Laterality Date   CARDIOVASCULAR STRESS TEST  2007   Neg per pt report   COLONOSCOPY WITH PROPOFOL N/A 08/05/2019   Procedure: COLONOSCOPY WITH PROPOFOL;  Surgeon: Rogene Houston, MD;  [facility];  Service: Endoscopy;  Laterality: N/A;  730     Current Outpatient Medications:    cabergoline (DOSTINEX) 0.5 MG tablet, Take  0.5 mg by mouth 2 (two) times a week., Disp: , Rfl:    citalopram (CELEXA) 20 MG tablet, Take 1 tablet (20 mg total) by mouth daily., Disp: 90 tablet, Rfl: 3   ibuprofen (ADVIL) 200 MG tablet, Take 200 mg by mouth every 6 (six) hours as needed., Disp: , Rfl:    acetaminophen (TYLENOL) 500 MG tablet, Take 500 mg by mouth every 6 (six) hours as needed. (Patient not taking: Reported on 10/25/2021), Disp: , Rfl:    cyclobenzaprine (FLEXERIL) 10 MG tablet, Take 1 tablet (10 mg total) by mouth 3 (three) times daily as needed for muscle spasms. (Patient not taking: Reported on 10/25/2021), Disp: 30 tablet, Rfl: 0   diclofenac (VOLTAREN) 75 MG EC tablet, Take 1 tablet (75 mg total) by mouth 2 (two) times daily as needed. For pain. (Patient not taking: Reported on 10/25/2021), Disp: 30 tablet, Rfl: 0  EXAM:  VITALS per patient if applicable:     5/40/9811    2:31 PM 10/18/2021    1:29 PM 09/17/2020    2:33 PM  Vitals with BMI  Height   '6\' 2"'$   Weight 233 lbs  225 lbs  BMI   91.47  Systolic 829 562   Diastolic 68 83   Pulse 57 92      GENERAL: alert, oriented, appears well and in no acute distress  HEENT: atraumatic, conjunttiva clear, no obvious abnormalities on inspection of external nose and ears  NECK: normal movements of the head and neck  LUNGS: on inspection no signs of respiratory distress, breathing rate appears normal, no obvious gross SOB, gasping or wheezing  CV: no obvious cyanosis  MS: moves all visible extremities without noticeable abnormality  PSYCH/NEURO: pleasant and cooperative, no obvious depression or anxiety, speech and thought processing grossly intact  LABS: none today    Chemistry      Component Value Date/Time   NA 139 07/01/2018 0854   K 4.3 07/01/2018 0854   CL 101 07/01/2018 0854   CO2 29 07/01/2018 0854   BUN 21 07/01/2018 0854   CREATININE 1.07 07/01/2018 0854      Component Value Date/Time   CALCIUM 9.5 07/01/2018 0854   ALKPHOS 72 07/01/2018 0854    AST 20 07/01/2018 0854   ALT 18 07/01/2018 0854   BILITOT 0.6 07/01/2018 0854     ASSESSMENT AND PLAN:  Discussed the following assessment and plan:  Acute bilateral low back pain without radiculopathy. Muscle spasm is a prominent symptom. We will try to relieve this with Flexeril 10 mg 3 times daily as needed.  He also states diclofenac has helped him significantly in the past in the situations.  We will send in 75 mg diclofenac, 1 twice daily as needed. Signs/symptoms to call or return for were reviewed and pt expressed understanding.   I discussed the assessment and treatment plan with the patient. The patient was provided an opportunity to ask questions and all were answered. The patient agreed with the plan and demonstrated an understanding of the instructions.   F/u: If not significantly improving in 4 to 5 days  Signed:  Crissie Sickles, MD           11/07/2021

## 2021-11-07 NOTE — Telephone Encounter (Signed)
Spoke with pt and he currently has flexeril on his med list from 09/17/20 when he saw Alma Friendly NP. As of 6/16, pt reported not taking.   Please review and advise if ok for virtual appt.

## 2021-11-07 NOTE — Telephone Encounter (Signed)
Okay for virtual 

## 2021-11-07 NOTE — Telephone Encounter (Signed)
Pt advised VV can be completed and has been scheduled.

## 2021-11-07 NOTE — Telephone Encounter (Signed)
Pt called asking for Flexeril to be sent to pharmacy as he has fallen and hurt his back. Patient was informed that he would need an appointment to be evaluated. Patient stated he couldn't move due to the severe back pain but could do a virtual appointment.   It is noted that patient has not been seen since 2021.

## 2023-12-21 ENCOUNTER — Ambulatory Visit: Payer: Self-pay

## 2023-12-21 NOTE — Telephone Encounter (Signed)
 Pt scheduled for visit with Dr. Kuneff

## 2023-12-21 NOTE — Telephone Encounter (Signed)
 FYI Only or Action Required?: FYI only for provider.  Patient was last seen in primary care on 11/07/2021 by McGowen, Aleene DEL, MD.  Called Nurse Triage reporting Anxiety and Back Pain.  Symptoms began several weeks ago.  Interventions attempted: OTC medications: Tylenol  and ibuprofen.  Symptoms are: gradually worsening.  Triage Disposition: See PCP When Office is Open (Within 3 Days)  Patient/caregiver understands and will follow disposition?: Yes                             Copied from CRM #[id number]. Topic: Clinical - Red Word Triage >> Dec 21, 2023  2:26 PM Billy Alvarez wrote: Patient is having severe lower pain and also dealing with anxiety as well. He wants to be re prescribed the medications he was on before as well with Dr McGowen. Reason for Disposition  MODERATE anxiety (e.g., persistent or frequent anxiety symptoms; interferes with sleep, school, or work)  Answer Assessment - Initial Assessment Questions 1. ONSET: When did the pain begin? (e.g., minutes, hours, days)     Ongoing, worsened within past couple of months 2. LOCATION: Where does it hurt? (upper, mid or lower back)     Lower back 3. SEVERITY: How bad is the pain?  (e.g., Scale 1-10; mild, moderate, or severe)     More so stiffness and limited mobility than pain 4. PATTERN: Is the pain constant? (e.g., yes, no; constant, intermittent)      Constant  5. RADIATION: Does the pain shoot into your legs or somewhere else?     States pain stays localized 6. CAUSE:  What do you think is causing the back pain?      Bulging disc or hernia 7. BACK OVERUSE:  Any recent lifting of heavy objects, strenuous work or exercise?     Denies  8. MEDICINES: What have you taken so far for the pain? (e.g., nothing, acetaminophen , NSAIDS)     Tylenol  or ibuprofen 9. NEUROLOGIC SYMPTOMS: Do you have any weakness, numbness, or problems with bowel/bladder control?     Denies weakness, denies  numbness, states he is still using restroom normally 10. OTHER SYMPTOMS: Do you have any other symptoms? (e.g., fever, abdomen pain, burning with urination, blood in urine)     Stiffness, especially in mornings    Patient requesting refills for Voltaren  and Celexa . Patient stated he last saw his PCP 2 years ago. Per chart, last visit was 2 years ago. DT allowed this RN to schedule an acute visit.  Answer Assessment - Initial Assessment Questions 1. CONCERN: Did anything happen that prompted you to call today?      Seeking refill of Celexa  due to worsening anxiety  3. ONSET: How long have you been feeling this way? (e.g., hours, days, weeks)     Past couple of weeks 4. SEVERITY: How would you rate the level of anxiety? (e.g., 0 - 10; or mild, moderate, severe).     Mild 5. FUNCTIONAL IMPAIRMENT: How have these feelings affected your ability to do daily activities? Have you had more difficulty than usual doing your normal daily activities? (e.g., getting better, same, worse; self-care, school, work, interactions)     States he is able to perform self care as normal 6. HISTORY: Have you felt this way before? Have you ever been diagnosed with an anxiety problem in the past? (e.g., generalized anxiety disorder, panic attacks, PTSD). If Yes, ask: How was this problem treated? (e.g., medicines, counseling,  etc.)     Yes, history of anxiety  7. RISK OF HARM - SUICIDAL IDEATION: Do you ever have thoughts of hurting or killing yourself? If Yes, ask:  Do you have these feelings now? Do you have a plan on how you would do this?     Denies SI, denies thoughts of harming others 8. TREATMENT:  What has been done so far to treat this anxiety? (e.g., medicines, relaxation strategies). What has helped?     States he used to take Celexa   10. POTENTIAL TRIGGERS: Do you drink caffeinated beverages (e.g., coffee, colas, teas), and how much daily? Do you drink alcohol or use any  drugs? Have you started any new medicines recently?       Death in the family, family and work issues  11. PATIENT SUPPORT: Who is with you now? Who do you live with? Do you have family or friends who you can talk to?        Yes, lives at home with wife 23. OTHER SYMPTOMS: Do you have any other symptoms? (e.g., feeling depressed, trouble concentrating, trouble sleeping, trouble breathing, palpitations or fast heartbeat, chest pain, sweating, nausea, or diarrhea)       Tightness in chest when anxiety sets in, lightheadedness when anxiety sets in  Protocols used: Back Pain-A-AH, Anxiety and Panic Attack-A-AH

## 2023-12-23 ENCOUNTER — Encounter: Payer: Self-pay | Admitting: Family Medicine

## 2023-12-23 ENCOUNTER — Ambulatory Visit: Admitting: Family Medicine

## 2023-12-23 VITALS — BP 130/80 | HR 66 | Temp 98.1°F | Wt 225.0 lb

## 2023-12-23 DIAGNOSIS — Z0000 Encounter for general adult medical examination without abnormal findings: Secondary | ICD-10-CM | POA: Diagnosis not present

## 2023-12-23 DIAGNOSIS — Z79899 Other long term (current) drug therapy: Secondary | ICD-10-CM | POA: Insufficient documentation

## 2023-12-23 DIAGNOSIS — M545 Low back pain, unspecified: Secondary | ICD-10-CM

## 2023-12-23 DIAGNOSIS — F411 Generalized anxiety disorder: Secondary | ICD-10-CM | POA: Diagnosis not present

## 2023-12-23 LAB — CBC
HCT: 44.4 % (ref 39.0–52.0)
Hemoglobin: 14.5 g/dL (ref 13.0–17.0)
MCHC: 32.6 g/dL (ref 30.0–36.0)
MCV: 89.4 fl (ref 78.0–100.0)
Platelets: 257 K/uL (ref 150.0–400.0)
RBC: 4.97 Mil/uL (ref 4.22–5.81)
RDW: 14 % (ref 11.5–15.5)
WBC: 5.8 K/uL (ref 4.0–10.5)

## 2023-12-23 LAB — HEPATIC FUNCTION PANEL
ALT: 17 U/L (ref 0–53)
AST: 20 U/L (ref 0–37)
Albumin: 4.5 g/dL (ref 3.5–5.2)
Alkaline Phosphatase: 72 U/L (ref 39–117)
Bilirubin, Direct: 0.1 mg/dL (ref 0.0–0.3)
Total Bilirubin: 0.6 mg/dL (ref 0.2–1.2)
Total Protein: 7 g/dL (ref 6.0–8.3)

## 2023-12-23 MED ORDER — DULOXETINE HCL 30 MG PO CPEP: 30.0000 mg | ORAL_CAPSULE | Freq: Every day | ORAL | 0 refills | Status: DC

## 2023-12-23 MED ORDER — DICLOFENAC SODIUM 75 MG PO TBEC
75.0000 mg | DELAYED_RELEASE_TABLET | Freq: Two times a day (BID) | ORAL | 0 refills | Status: DC
Start: 2023-12-23 — End: 2023-12-23

## 2023-12-23 MED ORDER — TIZANIDINE HCL 4 MG PO TABS: 2.0000 mg | ORAL_TABLET | Freq: Three times a day (TID) | ORAL | 0 refills | Status: AC | PRN

## 2023-12-23 MED ORDER — DICLOFENAC SODIUM 75 MG PO TBEC: 75.0000 mg | DELAYED_RELEASE_TABLET | Freq: Two times a day (BID) | ORAL | 0 refills | Status: DC

## 2023-12-23 NOTE — Progress Notes (Signed)
 Billy Alvarez , 1966/02/07, 58 y.o., male MRN: [id number] Patient Care Team    Relationship Specialty Notifications Start End  McGowen, Aleene DEL, MD PCP - General Family Medicine  12/23/23   Nahser, Aleene PARAS, MD (Inactive) Consulting Physician Cardiology  05/21/15   Dewight Rollo Ee, DO  Endocrinology  12/23/23     Chief Complaint  Patient presents with   Back Pain    Worsening over the last few weeks; lower back. Pt was previously prescribed Diclofenac , would like to try again.      Subjective: Billy Alvarez is a 58 y.o. Pt presents for reestablishment with Dr. McGowen, last seen over 2 yrs ago. He is being seeing acutely today d/t pcp out of the office.  Patient reports he is in right-sided left-sided back pain over the last few years.  He has noticed over the last couple months its become persistent.  He states the smallest thing can cause him to go into a back pain flare.  He has been on diclofenac  and muscle relaxer in the past and would like to consider restarting.  Patient reports the pain does not radiate to his lower extremities.  He denies any known back injury.  He reports he had an MRI completed many years ago.  Patient also wishes to discuss his anxiety.  He reports he had been on Celexa  many years ago and it worked really well for him.  He had Klonopin  0.5 mg on standby if needed for panic but rarely ever needed and actually still has some leftover from a few years ago.  He states he is recently going through more stress.  He recently had a grandchild that was born prematurely at 49 weeks, and the baby passed away (male, Naomie ). Would like to get back on medication to help him with his anxiety and coping.      12/23/2023    3:44 PM 09/17/2020    2:31 PM 12/30/2018    8:10 AM  Depression screen PHQ 2/9  Decreased Interest 0 0 0  Down, Depressed, Hopeless 0 0 0  PHQ - 2 Score 0 0 0  Altered sleeping  0   Tired, decreased energy  0   Change in appetite   0   Feeling bad or failure about yourself   0   Trouble concentrating  0   Moving slowly or fidgety/restless  0   Suicidal thoughts  0   PHQ-9 Score  0   Difficult doing work/chores  Not difficult at all     No Known Allergies Social History   Social History Narrative   Married, 1 adult daughter, 1 adult son.   Educ: HS   Living in [place].   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   No T/A/Ds.   Past Medical History:  Diagnosis Date   Atypical chest pain 2007; 2016   2007 stress test neg per pt   Colon cancer screening    cologuard neg 2017.  Referred pt for screening colonoscopy 06/2018.   Hyperlipidemia    med rx'd but pt eventually became noncompliant, preferred TLC approach.  Numbers stable 2018 and 2019.   Panic disorder    Pituitary tumor 2011   Microadenoma   Past Surgical History:  Procedure Laterality Date   CARDIOVASCULAR STRESS TEST  2007   Neg per pt report   COLONOSCOPY WITH PROPOFOL  N/A 08/05/2019   Procedure: COLONOSCOPY WITH PROPOFOL ;  Surgeon: Golda Claudis PENNER, MD;  [facility]:  [facility];  Service: Endoscopy;  Laterality: N/A;  730   Family History  Problem Relation Age of Onset   Heart disease Father    Heart attack Paternal Grandmother    Cancer Neg Hx    Diabetes Neg Hx    Allergies as of 12/23/2023   No Known Allergies      Medication List        Accurate as of December 23, 2023  3:51 PM. If you have any questions, ask your nurse or doctor.          STOP taking these medications    acetaminophen  500 MG tablet Commonly known as: TYLENOL  Stopped by: Charlies Bellini   citalopram  20 MG tablet Commonly known as: CELEXA  Stopped by: Charlies Bellini   cyclobenzaprine  10 MG tablet Commonly known as: FLEXERIL  Stopped by: Charlies Bellini   ibuprofen 200 MG tablet Commonly known as: ADVIL Stopped by: Charlies Bellini       TAKE these medications    cabergoline 0.5 MG tablet Commonly known as: DOSTINEX Take 0.5 mg by mouth 2 (two)  times a week.   diclofenac  75 MG EC tablet Commonly known as: VOLTAREN  Take 1 tablet (75 mg total) by mouth 2 (two) times daily with a meal. What changed:  when to take this reasons to take this Changed by: Charlies Bellini   DULoxetine  30 MG capsule Commonly known as: Cymbalta  Take 1 capsule (30 mg total) by mouth daily. Started by: Assyria Morreale   tiZANidine  4 MG tablet Commonly known as: Zanaflex  Take 0.5-1 tablets (2-4 mg total) by mouth every 8 (eight) hours as needed for muscle spasms. Started by: Charlies Bellini        All past medical history, surgical history, allergies, family history, immunizations andmedications were updated in the EMR today and reviewed under the history and medication portions of their EMR.     ROS Negative, with the exception of above mentioned in HPI   Objective:  BP 130/80   Pulse 66   Temp 98.1 F (36.7 C)   Wt 225 lb (102.1 kg)   SpO2 98%   BMI 28.89 kg/m  Body mass index is 28.89 kg/m. Physical Exam Vitals and nursing note reviewed. Exam conducted with a chaperone present.  Constitutional:      General: He is not in acute distress.    Appearance: Normal appearance. He is not ill-appearing, toxic-appearing or diaphoretic.  HENT:     Head: Normocephalic and atraumatic.  Eyes:     General: No scleral icterus.       Right eye: No discharge.        Left eye: No discharge.     Extraocular Movements: Extraocular movements intact.     Pupils: Pupils are equal, round, and reactive to light.  Musculoskeletal:     Lumbar back: Spasms and tenderness present. No swelling, edema or bony tenderness. Decreased range of motion. Negative right straight leg raise test and negative left straight leg raise test.       Back:     Comments: Lumbar spine: No bony tenderness or step-off.  No SI joint pain.  Very mild ropiness paraspinal lumbar muscles.  Full range of motion with some discomfort on flexion.  Neurovascularly intact distally.  Skin:     General: Skin is warm and dry.     Coloration: Skin is not jaundiced or pale.     Findings: No rash.  Neurological:     Mental Status: He is alert and  oriented to person, place, and time. Mental status is at baseline.  Psychiatric:        Mood and Affect: Mood normal.        Behavior: Behavior normal.        Thought Content: Thought content normal.        Judgment: Judgment normal.      No results found. No results found. No results found for this or any previous visit (from the past 24 hours).  Assessment/Plan: Billy Alvarez is a 58 y.o. male present for OV for Encounter for medical examination to establish care Acute right-sided low back pain without sciatica/Encounter for long-term current use of medication We discussed different options today for treatment.  Did review outside labs BMP, no liver function test to review. Start diclofenac  75 mg with meals twice daily Start Zanaflex  2-4 mg as needed - Hepatic function panel - CBC  Generalized anxiety disorder (Primary) We discussed different options for treatment today. Trial of Cymbalta  30 mg daily prescribed.  Had been on Celexa  in the past, may benefit from the extra MSK benefits with Cymbalta .  Patient is agreeable to plan. Follow-up with PCP in about 4 weeks    -Patient will need to follow-up within the next 2-4 weeks to reestablish  with PCP and follow-up on chronic conditions.  Reviewed expectations re: course of current medical issues. Discussed self-management of symptoms. Outlined signs and symptoms indicating need for more acute intervention. Patient verbalized understanding and all questions were answered. Patient received an After-Visit Summary.    Orders Placed This Encounter  Procedures   Hepatic function panel   CBC   Meds ordered this encounter  Medications   DISCONTD: diclofenac  (VOLTAREN ) 75 MG EC tablet    Sig: Take 1 tablet (75 mg total) by mouth 2 (two) times daily with a meal.     Dispense:  30 tablet    Refill:  0   DISCONTD: DULoxetine  (CYMBALTA ) 30 MG capsule    Sig: Take 1 capsule (30 mg total) by mouth daily.    Dispense:  30 capsule    Refill:  0   tiZANidine  (ZANAFLEX ) 4 MG tablet    Sig: Take 0.5-1 tablets (2-4 mg total) by mouth every 8 (eight) hours as needed for muscle spasms.    Dispense:  30 tablet    Refill:  0   DULoxetine  (CYMBALTA ) 30 MG capsule    Sig: Take 1 capsule (30 mg total) by mouth daily.    Dispense:  90 capsule    Refill:  0   diclofenac  (VOLTAREN ) 75 MG EC tablet    Sig: Take 1 tablet (75 mg total) by mouth 2 (two) times daily with a meal.    Dispense:  90 tablet    Refill:  0   Referral Orders  No referral(s) requested today     Note is dictated utilizing voice recognition software. Although note has been proof read prior to signing, occasional typographical errors still can be missed. If any questions arise, please do not hesitate to call for verification.   electronically signed by:  Charlies Bellini, DO  [facility] [place]

## 2023-12-23 NOTE — Patient Instructions (Addendum)
 Return in about 4 weeks (around 01/20/2024) for Routine chronic condition follow-up.        Great to see you today.  I have refilled the medication(s) we provide.   If labs were collected or images ordered, we will inform you of  results once we have received them and reviewed. We will contact you either by echart message, or telephone call.  Please give ample time to the testing facility, and our office to run,  receive and review results. Please do not call inquiring of results, even if you can see them in your chart. We will contact you as soon as we are able. If it has been over 1 week since the test was completed, and you have not yet heard from us , then please call us .    - echart message- for normal results that have been seen by the patient already.   - telephone call: abnormal results or if patient has not viewed results in their echart.  If a referral to a specialist was entered for you, please call us  in 2 weeks if you have not heard from the specialist office to schedule.

## 2023-12-24 ENCOUNTER — Ambulatory Visit: Payer: Self-pay | Admitting: Family Medicine

## 2024-01-20 ENCOUNTER — Ambulatory Visit: Payer: Self-pay | Admitting: Family Medicine

## 2024-01-20 ENCOUNTER — Ambulatory Visit (INDEPENDENT_AMBULATORY_CARE_PROVIDER_SITE_OTHER): Admitting: Family Medicine

## 2024-01-20 ENCOUNTER — Encounter: Payer: Self-pay | Admitting: Family Medicine

## 2024-01-20 VITALS — BP 122/72 | HR 50 | Temp 97.9°F | Ht 74.0 in | Wt 227.6 lb

## 2024-01-20 DIAGNOSIS — E7800 Pure hypercholesterolemia, unspecified: Secondary | ICD-10-CM | POA: Diagnosis not present

## 2024-01-20 DIAGNOSIS — Z125 Encounter for screening for malignant neoplasm of prostate: Secondary | ICD-10-CM

## 2024-01-20 DIAGNOSIS — F411 Generalized anxiety disorder: Secondary | ICD-10-CM

## 2024-01-20 DIAGNOSIS — M545 Low back pain, unspecified: Secondary | ICD-10-CM

## 2024-01-20 DIAGNOSIS — Z0000 Encounter for general adult medical examination without abnormal findings: Secondary | ICD-10-CM | POA: Diagnosis not present

## 2024-01-20 LAB — BASIC METABOLIC PANEL WITH GFR
BUN: 17 mg/dL (ref 6–23)
CO2: 30 meq/L (ref 19–32)
Calcium: 9.6 mg/dL (ref 8.4–10.5)
Chloride: 102 meq/L (ref 96–112)
Creatinine, Ser: 1.09 mg/dL (ref 0.40–1.50)
GFR: 73.91 mL/min (ref >60.00)
Glucose, Bld: 99 mg/dL (ref 70–99)
Potassium: 5 meq/L (ref 3.5–5.1)
Sodium: 141 meq/L (ref 135–145)

## 2024-01-20 LAB — LIPID PANEL
Cholesterol: 261 mg/dL — ABNORMAL HIGH (ref 0–200)
HDL: 44.3 mg/dL (ref >39.00)
LDL Cholesterol: 183 mg/dL — ABNORMAL HIGH (ref 0–99)
NonHDL: 216.56
Total CHOL/HDL Ratio: 6
Triglycerides: 169 mg/dL — ABNORMAL HIGH (ref 0.0–149.0)
VLDL: 33.8 mg/dL (ref 0.0–40.0)

## 2024-01-20 LAB — PSA: PSA: 0.6 ng/mL (ref 0.10–4.00)

## 2024-01-20 MED ORDER — CITALOPRAM HYDROBROMIDE 20 MG PO TABS: 20.0000 mg | ORAL_TABLET | Freq: Every day | ORAL | 0 refills | Status: DC

## 2024-01-20 NOTE — Patient Instructions (Signed)
 Take duloxetine  30mg  every other day for THREE DOSES, then stop  Start the citalopram  20mg  once a day TODAY

## 2024-01-20 NOTE — Progress Notes (Signed)
 OFFICE VISIT  01/20/2024  CC:  Chief Complaint  Patient presents with   Medical Management of Chronic Issues    Patient is a 58 y.o. male who presents for 1 month follow-up anxiety and low back pain. He was seen by Dr. Catherine about 1 month ago for these problems and was put on Cymbalta  30 mg a day as well as diclofenac  75 mg and Zanaflex  as needed.  INTERIM HX: Billy Alvarez does not feel like the Cymbalta  has helped any with his anxiety. He thinks about things excessively, ruminates on his anxieties, has some trouble sleeping, gets sweaty palms frequently.  No full panic attacks. Mood is mildly down but nothing severe. He is coping with the loss of a family member recently as well as trying to get used to a new position with his employer [employer]).  He does do weight training every day. His back is much better, responds well to duloxetine .  Past Medical History:  Diagnosis Date   Atypical chest pain 2007; 2016   2007 stress test neg per pt   Colon cancer screening    cologuard neg 2017.  Colonoscopy normal 2021   Hyperlipidemia    med rx'd but pt eventually became noncompliant, preferred TLC approach.  Numbers stable 2018 and 2019.   Panic disorder    Pituitary tumor 2011   Microadenoma    Past Surgical History:  Procedure Laterality Date   CARDIOVASCULAR STRESS TEST  2007   Neg per pt report   COLONOSCOPY WITH PROPOFOL  N/A 08/05/2019   Procedure: COLONOSCOPY WITH PROPOFOL ;  Surgeon: Golda Claudis PENNER, MD;  Location: [facility];  Service: Endoscopy;  Laterality: N/A;  730    Outpatient Medications Prior to Visit  Medication Sig Dispense Refill   cabergoline (DOSTINEX) 0.5 MG tablet Take 0.5 mg by mouth 2 (two) times a week.     diclofenac  (VOLTAREN ) 75 MG EC tablet Take 1 tablet (75 mg total) by mouth 2 (two) times daily with a meal. 90 tablet 0   tiZANidine  (ZANAFLEX ) 4 MG tablet Take 0.5-1 tablets (2-4 mg total) by mouth every 8 (eight) hours as needed for muscle spasms.  30 tablet 0   DULoxetine  (CYMBALTA ) 30 MG capsule Take 1 capsule (30 mg total) by mouth daily. 90 capsule 0   No facility-administered medications prior to visit.    No Known Allergies  Review of Systems As per HPI  PE:    01/20/2024    8:11 AM 12/23/2023    8:38 AM 11/07/2021    2:31 PM  Vitals with BMI  Height 6' 2    Weight 227 lbs 10 oz 225 lbs 233 lbs  BMI 29.21    Systolic 122 130 867  Diastolic 72 80 68  Pulse 50 66 57     Physical Exam  Gen: Alert, well appearing.  Patient is oriented to person, place, time, and situation. AFFECT: pleasant, lucid thought and speech. ENT: Ears: EACs clear, normal epithelium.  TMs with good light reflex and landmarks bilaterally.  Eyes: no injection, icteris, swelling, or exudate.  EOMI, PERRLA. Nose: no drainage or turbinate edema/swelling.  No injection or focal lesion.  Mouth: lips without lesion/swelling.  Oral mucosa pink and moist.  Dentition intact and without obvious caries or gingival swelling.  Oropharynx without erythema, exudate, or swelling.  Neck: supple/nontender.  No LAD, mass, or TM.  Carotid pulses 2+ bilaterally, without bruits. CV: RRR, no m/r/g.   LUNGS: CTA bilat, nonlabored resps, good aeration in all  lung fields. ABD: soft, NT, ND, BS normal.  No hepatospenomegaly or mass.  No bruits. EXT: no clubbing, cyanosis, or edema.  Musculoskeletal: no joint swelling, erythema, warmth, or tenderness.  ROM of all joints intact. Skin - no sores or suspicious lesions or rashes or color changes   LABS:  Last CBC Lab Results  Component Value Date   WBC 5.8 12/23/2023   HGB 14.5 12/23/2023   HCT 44.4 12/23/2023   MCV 89.4 12/23/2023   MCH 30.6 05/16/2015   RDW 14.0 12/23/2023   PLT 257.0 12/23/2023   Last metabolic panel Lab Results  Component Value Date   GLUCOSE 85 07/01/2018   NA 139 07/01/2018   K 4.3 07/01/2018   CL 101 07/01/2018   CO2 29 07/01/2018   BUN 21 07/01/2018   CREATININE 1.07 07/01/2018    GFR 71.84 07/01/2018   CALCIUM  9.5 07/01/2018   PROT 7.0 12/23/2023   ALBUMIN 4.5 12/23/2023   BILITOT 0.6 12/23/2023   ALKPHOS 72 12/23/2023   AST 20 12/23/2023   ALT 17 12/23/2023   ANIONGAP 9 05/16/2015   Last lipids Lab Results  Component Value Date   CHOL 234 (H) 07/01/2018   HDL 49.40 07/01/2018   LDLCALC 157 (H) 07/01/2018   TRIG 137.0 07/01/2018   CHOLHDL 5 07/01/2018   Last thyroid  functions Lab Results  Component Value Date   TSH 2.17 07/01/2018   Lab Results  Component Value Date   PSA 0.35 07/01/2018   PSA 0.41 06/14/2015   IMPRESSION AND PLAN:  #1 health maintenance exam: Reviewed age and gender appropriate health maintenance issues (prudent diet, regular exercise, health risks of tobacco and excessive alcohol, use of seatbelts, fire alarms in home, use of sunscreen).  Also reviewed age and gender appropriate health screening as well as vaccine recommendations. Vaccines: Tdap-> declined.  Prevnar 20-> declined. Labs: CBC and hepatic panel were normal 1 month ago.  Will check lipid panel, basic metabolic panel, and PSA today. Prostate ca screening: PSA Colon ca screening: next colonoscopy 2031  #2 GAD.  No help from duloxetine  x 1 month. Will switch him over to citalopram  which helped very well a few years ago. Take duloxetine  30 mg every other day for 3 doses. Start citalopram  20 mg a day today.  In 2 weeks if he is not feeling significant improvement he can double this and I will follow-up in a month.  3.  Recurrent low back pain. He responds very well to diclofenac  75 mg twice daily as needed.  He also has Zanaflex  to take as needed.  #4 mixed hyperlipidemia. TLC in the past. Lipid panel today.  #5 pituitary adenoma.  He has been on cabergoline long-term.  He gets annual follow-up with endocrinology and per his report all labs have been stable.  An After Visit Summary was printed and given to the patient.  FOLLOW UP: Return in about 4 weeks (around  02/17/2024) for f/u anx.  Signed:  Gerlene Hockey, MD           01/20/2024

## 2024-01-21 ENCOUNTER — Encounter: Payer: Self-pay | Admitting: Family Medicine

## 2024-01-21 MED ORDER — ATORVASTATIN CALCIUM 20 MG PO TABS: 20.0000 mg | ORAL_TABLET | Freq: Every day | ORAL | 2 refills | Status: AC

## 2024-01-21 NOTE — Telephone Encounter (Signed)
 Yes, lets start Lipitor. I will send in a prescription now. Please make a lab appointment in 2 to 3 months for recheck of cholesterol level.

## 2024-01-22 NOTE — Telephone Encounter (Signed)
 No further action needed.

## 2024-01-25 ENCOUNTER — Other Ambulatory Visit: Payer: Self-pay | Admitting: Family Medicine

## 2024-02-12 ENCOUNTER — Encounter: Payer: Self-pay | Admitting: Family Medicine

## 2024-02-12 MED ORDER — CITALOPRAM HYDROBROMIDE 40 MG PO TABS: 40.0000 mg | ORAL_TABLET | Freq: Every day | ORAL | 3 refills | Status: AC

## 2024-02-12 NOTE — Telephone Encounter (Signed)
 Citalopram  40 mg prescription sent

## 2024-02-17 ENCOUNTER — Ambulatory Visit: Admitting: Family Medicine

## 2024-02-28 ENCOUNTER — Other Ambulatory Visit: Payer: Self-pay | Admitting: Family Medicine

## 2024-03-21 ENCOUNTER — Other Ambulatory Visit: Payer: Self-pay | Admitting: Family Medicine

## 2024-04-11 ENCOUNTER — Ambulatory Visit: Admitting: Family Medicine

## 2024-04-13 ENCOUNTER — Other Ambulatory Visit: Payer: Self-pay | Admitting: Family Medicine
